# Patient Record
Sex: Female | Born: 1970
Health system: Southern US, Community
[De-identification: ages and names within clinical notes are randomized; demographics above are authoritative.]

## PROBLEM LIST (undated history)

## (undated) DIAGNOSIS — R5383 Other fatigue: Secondary | ICD-10-CM

## (undated) DIAGNOSIS — Z803 Family history of malignant neoplasm of breast: Secondary | ICD-10-CM

## (undated) DIAGNOSIS — Z780 Asymptomatic menopausal state: Secondary | ICD-10-CM

## (undated) DIAGNOSIS — Z8042 Family history of malignant neoplasm of prostate: Secondary | ICD-10-CM

## (undated) DIAGNOSIS — F329 Major depressive disorder, single episode, unspecified: Secondary | ICD-10-CM

## (undated) DIAGNOSIS — E079 Disorder of thyroid, unspecified: Secondary | ICD-10-CM

## (undated) DIAGNOSIS — I10 Essential (primary) hypertension: Secondary | ICD-10-CM

## (undated) DIAGNOSIS — E785 Hyperlipidemia, unspecified: Secondary | ICD-10-CM

## (undated) DIAGNOSIS — F411 Generalized anxiety disorder: Secondary | ICD-10-CM

## (undated) DIAGNOSIS — R51 Headache: Secondary | ICD-10-CM

## (undated) DIAGNOSIS — G43909 Migraine, unspecified, not intractable, without status migrainosus: Secondary | ICD-10-CM

## (undated) DIAGNOSIS — Z800 Family history of malignant neoplasm of digestive organs: Secondary | ICD-10-CM

## (undated) DIAGNOSIS — Z8481 Family history of carrier of genetic disease: Secondary | ICD-10-CM

## (undated) DIAGNOSIS — R7302 Impaired glucose tolerance (oral): Secondary | ICD-10-CM

## (undated) DIAGNOSIS — R5381 Other malaise: Secondary | ICD-10-CM

## (undated) HISTORY — DX: Family history of malignant neoplasm of digestive organs: Z80.0

## (undated) HISTORY — DX: Asymptomatic menopausal state: Z78.0

## (undated) HISTORY — DX: Major depressive disorder, single episode, unspecified: F32.9

## (undated) HISTORY — DX: Essential (primary) hypertension: I10

## (undated) HISTORY — DX: Hyperlipidemia, unspecified: E78.5

## (undated) HISTORY — DX: Headache: R51

## (undated) HISTORY — DX: Disorder of thyroid, unspecified: E07.9

## (undated) HISTORY — DX: Generalized anxiety disorder: F41.1

## (undated) HISTORY — DX: Other fatigue: R53.83

## (undated) HISTORY — DX: Impaired glucose tolerance (oral): R73.02

## (undated) HISTORY — DX: Other malaise: R53.81

## (undated) HISTORY — DX: Family history of carrier of genetic disease: Z84.81

## (undated) HISTORY — DX: Migraine, unspecified, not intractable, without status migrainosus: G43.909

## (undated) HISTORY — DX: Family history of malignant neoplasm of prostate: Z80.42

## (undated) HISTORY — DX: Family history of malignant neoplasm of breast: Z80.3

---

## 1998-04-12 ENCOUNTER — Other Ambulatory Visit: Admission: RE | Admit: 1998-04-12 | Discharge: 1998-04-12 | Payer: Self-pay | Admitting: *Deleted

## 1998-11-04 ENCOUNTER — Observation Stay (HOSPITAL_COMMUNITY): Admission: AD | Admit: 1998-11-04 | Discharge: 1998-11-05 | Payer: Self-pay | Admitting: *Deleted

## 1998-11-05 ENCOUNTER — Encounter: Payer: Self-pay | Admitting: *Deleted

## 1998-11-18 ENCOUNTER — Encounter (HOSPITAL_COMMUNITY): Admission: RE | Admit: 1998-11-18 | Discharge: 1998-11-25 | Payer: Self-pay | Admitting: *Deleted

## 1998-11-24 ENCOUNTER — Inpatient Hospital Stay (HOSPITAL_COMMUNITY): Admission: AD | Admit: 1998-11-24 | Discharge: 1998-11-26 | Payer: Self-pay | Admitting: Obstetrics and Gynecology

## 1998-11-26 ENCOUNTER — Encounter (HOSPITAL_COMMUNITY): Admission: RE | Admit: 1998-11-26 | Discharge: 1999-02-24 | Payer: Self-pay | Admitting: *Deleted

## 1999-01-04 ENCOUNTER — Other Ambulatory Visit: Admission: RE | Admit: 1999-01-04 | Discharge: 1999-01-04 | Payer: Self-pay | Admitting: *Deleted

## 2000-03-06 ENCOUNTER — Other Ambulatory Visit: Admission: RE | Admit: 2000-03-06 | Discharge: 2000-03-06 | Payer: Self-pay | Admitting: *Deleted

## 2002-05-04 ENCOUNTER — Other Ambulatory Visit: Admission: RE | Admit: 2002-05-04 | Discharge: 2002-05-04 | Payer: Self-pay | Admitting: Obstetrics & Gynecology

## 2003-05-10 ENCOUNTER — Other Ambulatory Visit: Admission: RE | Admit: 2003-05-10 | Discharge: 2003-05-10 | Payer: Self-pay | Admitting: Obstetrics and Gynecology

## 2003-07-15 ENCOUNTER — Ambulatory Visit: Admission: RE | Admit: 2003-07-15 | Discharge: 2003-07-15 | Payer: Self-pay | Admitting: Obstetrics & Gynecology

## 2003-07-15 ENCOUNTER — Encounter: Payer: Self-pay | Admitting: Cardiology

## 2003-12-16 ENCOUNTER — Inpatient Hospital Stay (HOSPITAL_COMMUNITY): Admission: AD | Admit: 2003-12-16 | Discharge: 2003-12-21 | Payer: Self-pay | Admitting: Obstetrics & Gynecology

## 2003-12-16 ENCOUNTER — Encounter (INDEPENDENT_AMBULATORY_CARE_PROVIDER_SITE_OTHER): Payer: Self-pay | Admitting: Specialist

## 2003-12-21 ENCOUNTER — Inpatient Hospital Stay (HOSPITAL_COMMUNITY): Admission: AD | Admit: 2003-12-21 | Discharge: 2003-12-24 | Payer: Self-pay | Admitting: Obstetrics and Gynecology

## 2003-12-22 ENCOUNTER — Encounter: Payer: Self-pay | Admitting: Obstetrics and Gynecology

## 2004-01-11 ENCOUNTER — Other Ambulatory Visit: Admission: RE | Admit: 2004-01-11 | Discharge: 2004-01-11 | Payer: Self-pay | Admitting: Obstetrics & Gynecology

## 2004-07-27 ENCOUNTER — Ambulatory Visit: Payer: Self-pay | Admitting: Internal Medicine

## 2004-09-01 ENCOUNTER — Ambulatory Visit: Payer: Self-pay | Admitting: Internal Medicine

## 2004-09-18 ENCOUNTER — Ambulatory Visit: Payer: Self-pay | Admitting: Internal Medicine

## 2005-10-10 ENCOUNTER — Ambulatory Visit: Payer: Self-pay | Admitting: Internal Medicine

## 2005-12-20 ENCOUNTER — Ambulatory Visit: Payer: Self-pay | Admitting: Internal Medicine

## 2006-01-09 ENCOUNTER — Encounter: Admission: RE | Admit: 2006-01-09 | Discharge: 2006-01-09 | Payer: Self-pay | Admitting: Obstetrics and Gynecology

## 2006-07-23 ENCOUNTER — Ambulatory Visit: Payer: Self-pay | Admitting: Internal Medicine

## 2006-07-23 LAB — CONVERTED CEMR LAB
ALT: 15 units/L (ref 0–40)
AST: 26 units/L (ref 0–37)
Albumin: 4 g/dL (ref 3.5–5.2)
Alkaline Phosphatase: 57 units/L (ref 39–117)
BUN: 9 mg/dL (ref 6–23)
Basophils Absolute: 0.2 10*3/uL — ABNORMAL HIGH (ref 0.0–0.1)
Basophils Relative: 3.6 % — ABNORMAL HIGH (ref 0.0–1.0)
CO2: 24 meq/L (ref 19–32)
Calcium: 9 mg/dL (ref 8.4–10.5)
Chloride: 103 meq/L (ref 96–112)
Chol/HDL Ratio, serum: 3.5
Cholesterol: 184 mg/dL (ref 0–200)
Creatinine, Ser: 0.7 mg/dL (ref 0.4–1.2)
Eosinophil percent: 2.5 % (ref 0.0–5.0)
GFR calc non Af Amer: 101 mL/min
Glomerular Filtration Rate, Af Am: 122 mL/min/{1.73_m2}
Glucose, Bld: 97 mg/dL (ref 70–99)
HCT: 36.7 % (ref 36.0–46.0)
HDL: 52.9 mg/dL (ref >39.0)
Hemoglobin: 12.7 g/dL (ref 12.0–15.0)
LDL Cholesterol: 122 mg/dL — ABNORMAL HIGH (ref 0–99)
Lymphocytes Relative: 27.1 % (ref 12.0–46.0)
MCHC: 34.7 g/dL (ref 30.0–36.0)
MCV: 94.4 fL (ref 78.0–100.0)
Monocytes Absolute: 0.3 10*3/uL (ref 0.2–0.7)
Monocytes Relative: 5.6 % (ref 3.0–11.0)
Neutro Abs: 3.7 10*3/uL (ref 1.4–7.7)
Neutrophils Relative %: 61.2 % (ref 43.0–77.0)
Platelets: 169 10*3/uL (ref 150–400)
Potassium: 4.2 meq/L (ref 3.5–5.1)
RBC: 3.89 M/uL (ref 3.87–5.11)
RDW: 11.7 % (ref 11.5–14.6)
Sodium: 135 meq/L (ref 135–145)
TSH: 1.58 microintl units/mL (ref 0.35–5.50)
Total Bilirubin: 1 mg/dL (ref 0.3–1.2)
Total Protein: 6.6 g/dL (ref 6.0–8.3)
Triglyceride fasting, serum: 48 mg/dL (ref 0–149)
VLDL: 10 mg/dL (ref 0–40)
WBC: 6 10*3/uL (ref 4.5–10.5)

## 2006-11-20 ENCOUNTER — Ambulatory Visit: Admission: RE | Admit: 2006-11-20 | Discharge: 2006-11-20 | Payer: Self-pay | Admitting: Gynecologic Oncology

## 2007-04-03 ENCOUNTER — Encounter: Payer: Self-pay | Admitting: Internal Medicine

## 2007-04-03 DIAGNOSIS — G43909 Migraine, unspecified, not intractable, without status migrainosus: Secondary | ICD-10-CM | POA: Insufficient documentation

## 2007-04-03 HISTORY — DX: Migraine, unspecified, not intractable, without status migrainosus: G43.909

## 2007-04-07 DIAGNOSIS — F329 Major depressive disorder, single episode, unspecified: Secondary | ICD-10-CM | POA: Insufficient documentation

## 2007-04-07 DIAGNOSIS — F411 Generalized anxiety disorder: Secondary | ICD-10-CM | POA: Insufficient documentation

## 2007-04-07 DIAGNOSIS — I10 Essential (primary) hypertension: Secondary | ICD-10-CM | POA: Insufficient documentation

## 2007-04-07 DIAGNOSIS — F3289 Other specified depressive episodes: Secondary | ICD-10-CM | POA: Insufficient documentation

## 2007-04-07 HISTORY — DX: Generalized anxiety disorder: F41.1

## 2007-04-07 HISTORY — DX: Major depressive disorder, single episode, unspecified: F32.9

## 2008-01-29 ENCOUNTER — Encounter: Admission: RE | Admit: 2008-01-29 | Discharge: 2008-01-29 | Payer: Self-pay | Admitting: *Deleted

## 2008-03-03 ENCOUNTER — Ambulatory Visit: Payer: Self-pay | Admitting: Internal Medicine

## 2008-03-03 DIAGNOSIS — R5381 Other malaise: Secondary | ICD-10-CM

## 2008-03-03 DIAGNOSIS — R351 Nocturia: Secondary | ICD-10-CM | POA: Insufficient documentation

## 2008-03-03 DIAGNOSIS — E739 Lactose intolerance, unspecified: Secondary | ICD-10-CM | POA: Insufficient documentation

## 2008-03-03 DIAGNOSIS — R51 Headache: Secondary | ICD-10-CM

## 2008-03-03 DIAGNOSIS — E785 Hyperlipidemia, unspecified: Secondary | ICD-10-CM

## 2008-03-03 DIAGNOSIS — R5383 Other fatigue: Secondary | ICD-10-CM

## 2008-03-03 DIAGNOSIS — R519 Headache, unspecified: Secondary | ICD-10-CM | POA: Insufficient documentation

## 2008-03-03 HISTORY — DX: Hyperlipidemia, unspecified: E78.5

## 2008-03-03 HISTORY — DX: Other malaise: R53.81

## 2008-03-03 HISTORY — DX: Headache: R51

## 2008-03-04 ENCOUNTER — Encounter: Payer: Self-pay | Admitting: Internal Medicine

## 2008-03-05 ENCOUNTER — Encounter: Payer: Self-pay | Admitting: Internal Medicine

## 2008-03-05 LAB — CONVERTED CEMR LAB
ALT: 13 units/L (ref 0–35)
AST: 19 units/L (ref 0–37)
Albumin: 4.6 g/dL (ref 3.5–5.2)
Alkaline Phosphatase: 63 units/L (ref 39–117)
BUN: 18 mg/dL (ref 6–23)
Basophils Absolute: 0 10*3/uL (ref 0.0–0.1)
Basophils Relative: 0.8 % (ref 0.0–3.0)
Bilirubin Urine: NEGATIVE
Bilirubin, Direct: 0.1 mg/dL (ref 0.0–0.3)
CO2: 27 meq/L (ref 19–32)
Calcium: 9.6 mg/dL (ref 8.4–10.5)
Chloride: 101 meq/L (ref 96–112)
Creatinine, Ser: 0.8 mg/dL (ref 0.4–1.2)
Crystals: NEGATIVE
Eosinophils Absolute: 0.1 10*3/uL (ref 0.0–0.7)
Eosinophils Relative: 1.3 % (ref 0.0–5.0)
GFR calc Af Amer: 104 mL/min
GFR calc non Af Amer: 86 mL/min
Glucose, Bld: 76 mg/dL (ref 70–99)
HCT: 41 % (ref 36.0–46.0)
Hemoglobin: 14 g/dL (ref 12.0–15.0)
Hgb A1c MFr Bld: 5.6 % (ref 4.6–6.0)
Ketones, ur: NEGATIVE mg/dL
Leukocytes, UA: NEGATIVE
Lipase: 26 units/L (ref 11.0–59.0)
Lymphocytes Relative: 26.3 % (ref 12.0–46.0)
MCHC: 34.1 g/dL (ref 30.0–36.0)
MCV: 92.3 fL (ref 78.0–100.0)
Monocytes Absolute: 0.5 10*3/uL (ref 0.1–1.0)
Monocytes Relative: 9.3 % (ref 3.0–12.0)
Neutro Abs: 3.5 10*3/uL (ref 1.4–7.7)
Neutrophils Relative %: 62.3 % (ref 43.0–77.0)
Nitrite: NEGATIVE
Platelets: 190 10*3/uL (ref 150–400)
Potassium: 3.7 meq/L (ref 3.5–5.1)
RBC: 4.44 M/uL (ref 3.87–5.11)
RDW: 11.7 % (ref 11.5–14.6)
Sodium: 136 meq/L (ref 135–145)
Specific Gravity, Urine: 1.015 (ref 1.000–1.03)
TSH: 2.56 microintl units/mL (ref 0.35–5.50)
Total Bilirubin: 1.1 mg/dL (ref 0.3–1.2)
Total Protein, Urine: NEGATIVE mg/dL
Total Protein: 7.4 g/dL (ref 6.0–8.3)
Urine Glucose: NEGATIVE mg/dL
Urobilinogen, UA: 0.2 (ref 0.0–1.0)
Vit D, 1,25-Dihydroxy: 40 (ref 30–89)
WBC: 5.6 10*3/uL (ref 4.5–10.5)
pH: 6 (ref 5.0–8.0)

## 2008-03-30 ENCOUNTER — Encounter: Admission: RE | Admit: 2008-03-30 | Discharge: 2008-03-30 | Payer: Self-pay | Admitting: Internal Medicine

## 2008-05-20 ENCOUNTER — Encounter: Payer: Self-pay | Admitting: Internal Medicine

## 2009-02-10 ENCOUNTER — Telehealth: Payer: Self-pay | Admitting: Internal Medicine

## 2009-02-15 ENCOUNTER — Encounter: Admission: RE | Admit: 2009-02-15 | Discharge: 2009-02-15 | Payer: Self-pay | Admitting: Obstetrics and Gynecology

## 2009-03-03 ENCOUNTER — Ambulatory Visit: Payer: Self-pay | Admitting: Internal Medicine

## 2009-03-03 LAB — CONVERTED CEMR LAB
ALT: 18 units/L (ref 0–35)
AST: 22 units/L (ref 0–37)
Albumin: 4.7 g/dL (ref 3.5–5.2)
Alkaline Phosphatase: 53 units/L (ref 39–117)
BUN: 12 mg/dL (ref 6–23)
Basophils Absolute: 0.3 10*3/uL — ABNORMAL HIGH (ref 0.0–0.1)
Basophils Relative: 3.5 % — ABNORMAL HIGH (ref 0.0–3.0)
Bilirubin Urine: NEGATIVE
Bilirubin, Direct: 0.1 mg/dL (ref 0.0–0.3)
CO2: 29 meq/L (ref 19–32)
Calcium: 9.8 mg/dL (ref 8.4–10.5)
Chloride: 104 meq/L (ref 96–112)
Cholesterol: 219 mg/dL — ABNORMAL HIGH (ref 0–200)
Creatinine, Ser: 0.8 mg/dL (ref 0.4–1.2)
Direct LDL: 136.2 mg/dL
Eosinophils Absolute: 0.1 10*3/uL (ref 0.0–0.7)
Eosinophils Relative: 1.1 % (ref 0.0–5.0)
GFR calc non Af Amer: 85.36 mL/min (ref >60)
Glucose, Bld: 96 mg/dL (ref 70–99)
HCT: 37.5 % (ref 36.0–46.0)
HDL: 71.9 mg/dL (ref >39.00)
Hemoglobin: 13.1 g/dL (ref 12.0–15.0)
Hgb A1c MFr Bld: 5.4 % (ref 4.6–6.5)
Ketones, ur: NEGATIVE mg/dL
Leukocytes, UA: NEGATIVE
Lymphocytes Relative: 27.9 % (ref 12.0–46.0)
Lymphs Abs: 2.1 10*3/uL (ref 0.7–4.0)
MCHC: 34.9 g/dL (ref 30.0–36.0)
MCV: 93.5 fL (ref 78.0–100.0)
Monocytes Absolute: 0.4 10*3/uL (ref 0.1–1.0)
Monocytes Relative: 5.2 % (ref 3.0–12.0)
Neutro Abs: 4.6 10*3/uL (ref 1.4–7.7)
Neutrophils Relative %: 62.3 % (ref 43.0–77.0)
Nitrite: NEGATIVE
Platelets: 168 10*3/uL (ref 150.0–400.0)
Potassium: 4.8 meq/L (ref 3.5–5.1)
RBC: 4.01 M/uL (ref 3.87–5.11)
RDW: 11.8 % (ref 11.5–14.6)
Sodium: 142 meq/L (ref 135–145)
Specific Gravity, Urine: >=1.03 (ref 1.000–1.030)
TSH: 1.92 microintl units/mL (ref 0.35–5.50)
Total Bilirubin: 0.8 mg/dL (ref 0.3–1.2)
Total CHOL/HDL Ratio: 3
Total Protein, Urine: NEGATIVE mg/dL
Total Protein: 7.6 g/dL (ref 6.0–8.3)
Triglycerides: 89 mg/dL (ref 0.0–149.0)
Urine Glucose: NEGATIVE mg/dL
Urobilinogen, UA: 0.2 (ref 0.0–1.0)
VLDL: 17.8 mg/dL (ref 0.0–40.0)
WBC: 7.5 10*3/uL (ref 4.5–10.5)
pH: 5.5 (ref 5.0–8.0)

## 2010-01-05 ENCOUNTER — Ambulatory Visit: Payer: Self-pay | Admitting: Internal Medicine

## 2010-02-27 ENCOUNTER — Ambulatory Visit: Payer: Self-pay | Admitting: Internal Medicine

## 2010-05-11 ENCOUNTER — Encounter: Admission: RE | Admit: 2010-05-11 | Discharge: 2010-05-11 | Payer: Self-pay | Admitting: Obstetrics and Gynecology

## 2010-05-17 ENCOUNTER — Encounter: Payer: Self-pay | Admitting: Internal Medicine

## 2010-09-07 NOTE — Assessment & Plan Note (Signed)
Summary: REOCURRING HEAD ACHES/LOW ENERGY--$50---STC   Vital Signs:  Patient Profile:   40 Years Old Female Weight:      153 pounds Temp:     97.1 degrees F oral Pulse rate:   69 / minute BP sitting:   116 / 81  (right arm) Cuff size:   regular  Vitals Entered By: Jerilynn Mages (March 03, 2008 8:38 AM)                 Chief Complaint:  reoccurring headaches/low energy/frequent urination/constipation.  History of Present Illness: lost 20 lbs she thinks intentionally since 12/07; has ongoing headache bitemproal low dull tension type without vision problem, n/v ; mother wants her to get he blood sugar checked - cbg by the mother at home was 166; terrible constipatoin, low energy; takes miralax daily; husband with vasectomy    Updated Prior Medication List: LISINOPRIL 10 MG  TABS (LISINOPRIL) 1 by mouth qd  Current Allergies (reviewed today): No known allergies   Past Medical History:    Reviewed history from 04/03/2007 and no changes required:       Hypertension       Migraine Headache       Anxiety       Depression       Hyperlipidemia  Past Surgical History:    Reviewed history from 04/03/2007 and no changes required:       Denies surgical history   Family History:    Reviewed history and no changes required:       2 cousins with early onset DM 66's (non-obese)       father with prostate cancer       mother iwth breast cancer       grandfather with heart disease and lung cancer       grandmother with HTN  Social History:    Reviewed history and no changes required:       Married       2 children       work - fulltime - Nature conservation officer       Never Smoked       Alcohol use-yes - social   Risk Factors:  Tobacco use:  never Alcohol use:  no   Review of Systems       all otherwise negative    Physical Exam  General:     alert and overweight-appearing, but has lost wt Head:     Normocephalic and atraumatic without obvious abnormalities. No  apparent alopecia or balding. Eyes:     No corneal or conjunctival inflammation noted. EOMI. Perrla. Ears:     External ear exam shows no significant lesions or deformities.  Otoscopic examination reveals clear canals, tympanic membranes are intact bilaterally without bulging, retraction, inflammation or discharge. Hearing is grossly normal bilaterally. Nose:     External nasal examination shows no deformity or inflammation. Nasal mucosa are pink and moist without lesions or exudates. Mouth:     Oral mucosa and oropharynx without lesions or exudates.  Teeth in good repair. Neck:     No deformities, masses, or tenderness noted. Lungs:     Normal respiratory effort, chest expands symmetrically. Lungs are clear to auscultation, no crackles or wheezes. Heart:     Normal rate and regular rhythm. S1 and S2 normal without gallop, murmur, click, rub or other extra sounds. Extremities:     no edema, no ulcers     Impression & Recommendations:  Problem # 1:  GLUCOSE INTOLERANCE (ICD-271.3) ? new DM with wt loss -  Orders: TLB-A1C / Hgb A1C (Glycohemoglobin) (83036-A1C) TLB-Lipase (83690-LIPASE)   Problem # 2:  FATIGUE (ICD-780.79) exam o/w benign - ok to follow with labs Orders: TLB-BMP (Basic Metabolic Panel-BMET) (80048-METABOL) TLB-CBC Platelet - w/Differential (85025-CBCD) TLB-Hepatic/Liver Function Pnl (80076-HEPATIC) TLB-TSH (Thyroid Stimulating Hormone) (84443-TSH) T-Vitamin D (25-Hydroxy) (16109-60454)   Problem # 3:  NOCTURIA (UJW-119.14) I suspect realted to elvated sugar - check urine as well Orders: TLB-Udip w/ Micro (81001-URINE) T-Culture, Urine (78295-62130)   Problem # 4:  HEADACHE (ICD-784.0) exam benign, ok to follow for now, consider MRI for persistent pain  Complete Medication List: 1)  Lisinopril 10 Mg Tabs (Lisinopril) .Marland Kitchen.. 1 by mouth qd   Patient Instructions: 1)  Please go to the Lab in the basement for your blood tests today 2)  Continue all  medications that you may have been taking previously 3)  If your sugar is high, you will be contacted about starting medication, and will likely be referred to Dr Everardo All, who is the endocrinologist for our group for best treatment 4)  Please schedule a follow-up appointment as needed. 5)  If the lab work is normal, but the headaches persist, please let us know, as you will likely want a Head MRI   ]

## 2010-09-07 NOTE — Assessment & Plan Note (Signed)
Summary: FU-MED-STC   Vital Signs:  Patient profile:   40 year old female Height:      69 inches Weight:      156.50 pounds BMI:     23.19 O2 Sat:      96 % on Room air Temp:     99.8 degrees F oral Pulse rate:   61 / minute BP sitting:   110 / 72  (left arm) Cuff size:   regular  Vitals Entered ByZella Ball Ewing (January 05, 2010 8:23 AM)  O2 Flow:  Room air CC: followup on BP medication/RE   CC:  followup on BP medication/RE.  History of Present Illness: here to f/iu ; has been on ACE since her daguther was born for the past 6 yrs, but now feels like "zombie": when she takes it in the AM;  BP at GYN was 105/60;  taking it in the evening seemed to help;  seemed to have a lot more energy on the days she did not take it;  wt post partum appoorx 6 yrs was was 155- 160 - no significent differene from now.  At one point she had gotten to 140 a few yrs ago and  BP was lower as well.  For the last 3 months has been doing more excericse with personal trainer - more muscle wt, less fat wt. Has been purposefully cutting out the salt in the diet for 3 mo as well as her "puffiness"  in the extremities. Pt denies CP, sob, doe, wheezing, orthopnea, pnd, worsening LE edema, palps, dizziness or syncope Pt denies new neuro symptoms such as headache, facial or extremity weakness    Problems Prior to Update: 1)  Preventive Health Care  (ICD-V70.0) 2)  Hyperlipidemia  (ICD-272.4) 3)  Headache  (ICD-784.0) 4)  Nocturia  (ICD-788.43) 5)  Fatigue  (ICD-780.79) 6)  Glucose Intolerance  (ICD-271.3) 7)  Depression  (ICD-311) 8)  Anxiety  (ICD-300.00) 9)  Hypertension  (ICD-401.9) 10)  Migraine Headache  (ICD-346.90)  Medications Prior to Update: 1)  Lisinopril 10 Mg  Tabs (Lisinopril) .Marland Kitchen.. 1 By Mouth Once Daily  Current Medications (verified): 1)  Lisinopril 5 Mg Tabs (Lisinopril) .Marland Kitchen.. 1po Once Daily  Allergies (verified): No Known Drug Allergies  Past History:  Past Medical History: Last  updated: 03/03/2008 Hypertension Migraine Headache Anxiety Depression Hyperlipidemia  Past Surgical History: Last updated: 04/03/2007 Denies surgical history  Social History: Last updated: 03/03/2008 Married 2 children work - fulltime - Nature conservation officer Never Smoked Alcohol use-yes - social  Risk Factors: Smoking Status: never (03/03/2008)  Review of Systems       all otherwise negative per pt -    Physical Exam  General:  alert and well-developed.   Head:  normocephalic and atraumatic.   Eyes:  vision grossly intact, pupils equal, and pupils round.   Ears:  R ear normal and L ear normal.   Nose:  no external deformity and no nasal discharge.   Mouth:  no gingival abnormalities and pharynx pink and moist.   Neck:  supple and no masses.   Lungs:  normal respiratory effort and normal breath sounds.   Heart:  normal rate and regular rhythm.   Abdomen:  soft, non-tender, and normal bowel sounds.   Extremities:  no edema, no erythema    Impression & Recommendations:  Problem # 1:  HYPERTENSION (ICD-401.9)  The following medications were removed from the medication list:    Lisinopril 10 Mg Tabs (Lisinopril) .Marland KitchenMarland KitchenMarland KitchenMarland Kitchen  1 by mouth once daily Her updated medication list for this problem includes:    Lisinopril 5 Mg Tabs (Lisinopril) .Marland Kitchen... 1po once daily  BP today: 110/72 Prior BP: 130/84 (03/03/2009)  Labs Reviewed: K+: 4.8 (03/03/2009) Creat: : 0.8 (03/03/2009)   Chol: 219 (03/03/2009)   HDL: 71.90 (03/03/2009)   LDL: 122 (07/23/2006)   TG: 89.0 (03/03/2009) treat as above, f/u any worsening signs or symptoms   Complete Medication List: 1)  Lisinopril 5 Mg Tabs (Lisinopril) .Marland Kitchen.. 1po once daily  Patient Instructions: 1)  decrease the lisinopril to 5 mg per day 2)  Continue all previous medications as before this visit 3)  Please schedule a follow-up appointment in 1 month with CPX labs Prescriptions: LISINOPRIL 5 MG TABS (LISINOPRIL) 1po once daily  #90 x 3    Entered and Authorized by:   Corwin Levins MD   Signed by:   Corwin Levins MD on 01/05/2010   Method used:   Print then Give to Patient   RxID:   [id number]

## 2010-09-07 NOTE — Assessment & Plan Note (Signed)
Summary: FU $50 STC   Vital Signs:  Patient profile:   40 year old female Height:      69 inches Weight:      158 pounds BMI:     23.42 O2 Sat:      98 % Temp:     97.5 degrees F oral Pulse rate:   66 / minute BP sitting:   130 / 84  (right arm) Cuff size:   regular  Vitals Entered By: Windell Norfolk (March 03, 2009 2:14 PM) CC: f/u on meds , med refills   CC:  f/u on meds  and med refills.  History of Present Illness: overall doing well, no complaints, not pregnant - husband with vasectomy; dneies CP, sob, doe, wheezing, orthopnea, pnd or LE edema  Problems Prior to Update: 1)  Preventive Health Care  (ICD-V70.0) 2)  Hyperlipidemia  (ICD-272.4) 3)  Headache  (ICD-784.0) 4)  Nocturia  (ICD-788.43) 5)  Fatigue  (ICD-780.79) 6)  Glucose Intolerance  (ICD-271.3) 7)  Depression  (ICD-311) 8)  Anxiety  (ICD-300.00) 9)  Hypertension  (ICD-401.9) 10)  Migraine Headache  (ICD-346.90)  Medications Prior to Update: 1)  Lisinopril 10 Mg  Tabs (Lisinopril) .Marland Kitchen.. 1 By Mouth Once Daily-- No Addtnl Refill Pt Overdue For Appt  Current Medications (verified): 1)  Lisinopril 10 Mg  Tabs (Lisinopril) .Marland Kitchen.. 1 By Mouth Once Daily  Allergies (verified): No Known Drug Allergies  Past History:  Past Medical History: Last updated: 03/03/2008 Hypertension Migraine Headache Anxiety Depression Hyperlipidemia  Past Surgical History: Last updated: 04/03/2007 Denies surgical history  Family History: Last updated: 03/03/2008 2 cousins with early onset DM 69's (non-obese) father with prostate cancer mother iwth breast cancer grandfather with heart disease and lung cancer grandmother with HTN  Social History: Last updated: 03/03/2008 Married 2 children work - fulltime - Nature conservation officer Never Smoked Alcohol use-yes - social  Risk Factors: Smoking Status: never (03/03/2008)  Review of Systems  The patient denies anorexia, fever, weight loss, weight gain, vision loss,  decreased hearing, hoarseness, chest pain, syncope, dyspnea on exertion, peripheral edema, prolonged cough, headaches, hemoptysis, abdominal pain, melena, hematochezia, severe indigestion/heartburn, hematuria, incontinence, muscle weakness, suspicious skin lesions, transient blindness, difficulty walking, depression, unusual weight change, abnormal bleeding, enlarged lymph nodes, angioedema, and breast masses.         all otherwise negative   Physical Exam  General:  alert and well-developed.   Head:  normocephalic and atraumatic.   Eyes:  vision grossly intact, pupils equal, and pupils round.   Ears:  R ear normal and L ear normal.   Nose:  no external deformity and no nasal discharge.   Mouth:  no gingival abnormalities and pharynx pink and moist.   Neck:  supple and no masses.   Lungs:  normal respiratory effort and normal breath sounds.   Heart:  normal rate and regular rhythm.   Abdomen:  soft, non-tender, and normal bowel sounds.   Msk:  no joint tenderness and no joint swelling.   Extremities:  no edema, no ulcers  Neurologic:  cranial nerves II-XII intact and strength normal in all extremities.     Impression & Recommendations:  Problem # 1:  Preventive Health Care (ICD-V70.0) Overall doing well, up to date, counseled on routine health concerns for screening and prevention, immunizations up to date or declined, labs ordered  Orders: TLB-BMP (Basic Metabolic Panel-BMET) (80048-METABOL) TLB-CBC Platelet - w/Differential (85025-CBCD) TLB-Hepatic/Liver Function Pnl (80076-HEPATIC) TLB-Lipid Panel (80061-LIPID) TLB-TSH (Thyroid Stimulating Hormone) (84443-TSH)  TLB-Udip ONLY (81003-UDIP)  Problem # 2:  GLUCOSE INTOLERANCE (ICD-271.3) asympt - to check a1c Orders: TLB-A1C / Hgb A1C (Glycohemoglobin) (83036-A1C)  Problem # 3:  HYPERTENSION (ICD-401.9)  Her updated medication list for this problem includes:    Lisinopril 10 Mg Tabs (Lisinopril) .Marland Kitchen... 1 by mouth once  daily stable overall by hx and exam, ok to continue meds/tx as is   Complete Medication List: 1)  Lisinopril 10 Mg Tabs (Lisinopril) .Marland Kitchen.. 1 by mouth once daily  Patient Instructions: 1)  Continue all medications that you may have been taking previously  2)  Please go to the Lab in the basement for your blood tests today 3)  Please schedule a follow-up appointment in 1 year or sooner if needed Prescriptions: LISINOPRIL 10 MG  TABS (LISINOPRIL) 1 by mouth once daily  #90 x 3   Entered and Authorized by:   Corwin Levins MD   Signed by:   Corwin Levins MD on 03/03/2009   Method used:   Print then Give to Patient   RxID:   [id number]

## 2010-09-07 NOTE — Progress Notes (Signed)
  Phone Note Refill Request Message from:  Fax from Pharmacy on February 10, 2009 1:25 PM  Refills Requested: Medication #1:  LISINOPRIL 10 MG  TABS 1 by mouth qd.   Dosage confirmed as above?Dosage Confirmed Initial call taken by: Windell Norfolk,  February 10, 2009 1:26 PM    New/Updated Medications: LISINOPRIL 10 MG  TABS (LISINOPRIL) 1 by mouth once daily-- NO ADDTNL REFILL PT OVERDUE FOR APPT   Prescriptions: LISINOPRIL 10 MG  TABS (LISINOPRIL) 1 by mouth once daily-- NO ADDTNL REFILL PT OVERDUE FOR APPT  #30 x 0   Entered by:   Windell Norfolk   Authorized by:   Corwin Levins MD   Signed by:   Windell Norfolk on 02/10/2009   Method used:   Electronically to        [facility] (retail)       [address]       [place], [place]  [address]       Ph: [phone]       Fax: [phone]   RxID:   [id number]

## 2010-09-07 NOTE — Miscellaneous (Signed)
Summary: Orders Update   Clinical Lists Changes  Orders: Added new Referral order of Radiology Referral (Radiology) - Signed 

## 2010-09-07 NOTE — Miscellaneous (Signed)
Summary: Immunization Entry    Influenza Vaccine (to be given today)   Appended Document: Immunization Entry Fluvrin 2009-10  Given at [facility] 05/20/2008

## 2010-09-07 NOTE — Assessment & Plan Note (Signed)
Summary: 1 mo rov /nws  #   Vital Signs:  Patient profile:   40 year old female Height:      69 inches Weight:      159.50 pounds BMI:     23.64 O2 Sat:      99 % on Room air Temp:     98.3 degrees F oral Pulse rate:   52 / minute BP sitting:   128 / 88  (left arm) Cuff size:   regular  Vitals Entered By: Zella Ball Ewing CMA (AAMA) (February 27, 2010 10:02 AM)  O2 Flow:  Room air  Preventive Care Screening     declines tetanus  CC: 1 month followup/RE   CC:  1 month followup/RE.  History of Present Illness: here to f/u - not taking the ace - take BP occasionally at home - usualy < 140/90; more excercisie lately and wathcing diet;  cue down on the salt ;  Pt denies CP, sob, doe, wheezing, orthopnea, pnd, worsening LE edema, palps, dizziness or syncope Pt denies new neuro symptoms such as headache, facial or extremity weakness  No fever, wt loss, night sweats, loss of appetite or other constitutional symptoms  No depressive symtpoms.  Was laid off but staying home with the kids and less stress.    Problems Prior to Update: 1)  Preventive Health Care  (ICD-V70.0) 2)  Hyperlipidemia  (ICD-272.4) 3)  Headache  (ICD-784.0) 4)  Nocturia  (ICD-788.43) 5)  Fatigue  (ICD-780.79) 6)  Glucose Intolerance  (ICD-271.3) 7)  Depression  (ICD-311) 8)  Anxiety  (ICD-300.00) 9)  Hypertension  (ICD-401.9) 10)  Migraine Headache  (ICD-346.90)  Medications Prior to Update: 1)  Lisinopril 5 Mg Tabs (Lisinopril) .Marland Kitchen.. 1po Once Daily  Current Medications (verified): 1)  Lisinopril 5 Mg Tabs (Lisinopril) .Marland Kitchen.. 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         all otherwise negative per pt -    Physical Exam  General:  alert and well-developed.   Head:  normocephalic and atraumatic.   Eyes:  vision grossly intact, pupils equal, and pupils round.   Ears:  R ear normal and L ear normal.   Nose:  no external deformity and no nasal discharge.   Mouth:  no gingival abnormalities and pharynx pink and moist.   Neck:  supple and no masses.   Lungs:  normal respiratory effort and normal breath sounds.   Heart:  normal rate and regular rhythm.   Abdomen:  soft, non-tender, and normal bowel sounds.   Msk:  no joint tenderness and no joint swelling.   Extremities:  no edema, no erythema  Neurologic:  cranial nerves II-XII intact and strength normal in all extremities.   Skin:  color normal and no rashes.   Psych:  not anxious appearing and not depressed appearing.     Impression & Recommendations:  Problem # 1:  Preventive Health Care (ICD-V70.0) Overall doing well, age appropriate education and counseling updated and referral for appropriate preventive services done unless declined, immunizations up to date or declined, diet counseling done if overweight, urged to quit smoking if smokes ,  most recent labs reviewed and current ordered if appropriate, ecg reviewed or declined (interpretation per ECG scanned in the EMR if done); information regarding Medicare Prevention requirements given if appropriate; speciality referrals updated as appropriate ; declines labs today  Complete Medication List: 1)  Lisinopril 5 Mg Tabs (Lisinopril) .Marland Kitchen.. 1po once daily

## 2010-12-22 NOTE — H&P (Signed)
NAMEJORGINA, April Ray             ACCOUNT NO.:  [id number]   MEDICAL RECORD NO.:  [id number]          PATIENT TYPE:  OUT   LOCATION:  GYN                          FACILITY:  [facility]   PHYSICIAN:  John T. Kyla Balzarine, M.D.    DATE OF BIRTH:  October 10, 1970   DATE OF ADMISSION:  11/20/2006  DATE OF DISCHARGE:                              HISTORY & PHYSICAL   CHIEF COMPLAINT:  Recurrent abnormal Pap smears suggesting low grade  SIL.   HISTORY OF PRESENT ILLNESS:  This patient relates longstanding normal  Pap smears until cesarean section for preeclampsia in 2005.  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  She subsequently normalized cytology until August 13, 2006  when she again had low grade SIL cytology.  She had persistent to low  grade SIL with possible high-grade SIL changes.  ECC was negative.  This  Pap smear was taken in the setting of a Mirena IUD.  The Mirena was  removed at the time of repeat LEEP biopsy with final pathology on  October 04, 2006 0comprising benign squamous mucosa with no  transformation zone identified and negative ECC, with benign  endocervical mucosa.  Because of failure to detect dysplasia, the  patient was sent for recommendations regarding management.   PAST HISTORY:  Dominated by hypertension complicating pregnancy with  severe preeclampsia or eclampsia.   MEDICATIONS:  Prinivil and Lexapro.   ALLERGIES:  None known.   FAMILY HISTORY:  Significant for heart disease and hypertension with a  couple of relatives having diabetes.  Her mother had breast cancer,  father prostate cancer, but no known ovarian or other gynecologic  malignancy.   PERSONAL AND SOCIAL HISTORY:  Married and denies tobacco but admits to  social ethanol.   REVIEW OF SYSTEMS:  NSVD x1 and cesarean section x1, SAB x1, otherwise  negative in the remainder of 10 system review.   PHYSICAL EXAMINATION:  Weight 171 pounds, blood  pressure 115/64,  respirations 18, pulse 76, temperature afebrile.  The patient is alert  and oriented x3 in no acute distress.  LYMPH SURVEY: Negative for pathologic lymphadenopathy.  ABDOMEN:  Soft and benign with well-healed transverse incision.  EXTREMITIES:  Have full strength and range of motion without edema,  cords or Homan's.  PELVIC:  External genitalia are without condylomatous  lesions and with vinegar staining, no acetowhite lesions.  I inspected  the vagina and cervix, revealing normal mucosa, bladder and urethra, and  well-healed cervical conization site.  Bimanual and rectovaginal  examinations disclose normal uterus with no adnexal pathology or  parametrium nodularity.   PROCEDURE NOTE:  After informed consent, I performed colposcopy of the  vaginal introitus and the vagina to the fornices, without visualization  of any acetowhite lesions.  The cervix had evidence of a healing cone  with complete re-epithelialization, but no pathologic acetowhite  epithelium.   ASSESSMENT:  Persistent low grade SIL cytology with negative recent  LEEP; no evidence of vaginal or vulvar dysplasia.   RECOMMENDATIONS:  I recommended that the patient have cytology  through  her primary gynecologist at 4-month intervals.  Indications for repeat  colposcopy would be persistence of low grade SIL for 24 months or the  progression of abnormalities on cytology beyond low grade SIL.  If she  normalized several consecutive Pap smears, she could refer to annual  follow-up.  I believe the majority of this follow-up could be carried  out under the auspices of Dr. Annabell Howells or her associates and we could see  her back at any time on a p.r.n. basis.      John T. Kyla Balzarine, M.D.  Electronically Signed     JTS/MEDQ  D:  11/20/2006  T:  11/20/2006  Job:  [id number]   cc:   Richardean Sale, M.D.  Fax: [phone]   Chester Holstein Earlene Plater, M.D.  Fax: [phone]

## 2010-12-22 NOTE — H&P (Signed)
NAME:  April Ray, April Ray                       ACCOUNT NO.:  [id number]   MEDICAL RECORD NO.:  [id number]                   PATIENT TYPE:  INP   LOCATION:  9372                                 FACILITY:  [facility]   PHYSICIAN:  Richardean Sale, M.D.                DATE OF BIRTH:  Nov 01, 1970   DATE OF ADMISSION:  12/16/2003  DATE OF DISCHARGE:                                HISTORY & PHYSICAL   ADMITTING DIAGNOSIS:  Thirty-three weeks pregnant with abdominal pain.   HISTORY OF PRESENT ILLNESS:  This is a 40 year old, gravida 3, para 1-0-1-1,  white female at 39 weeks and 5 days gestation who called on the evening of  Dec 16, 2003 at approximately 7:30 p.m. complaining of abdominal pain.  The  patient reported over the phone that her blood pressure had been elevated in  the office earlier that day.  She had been evaluated and was started on  [place], but the patient had yet to have the prescription fulfilled.  The  patient was instructed to report to maternity admissions for evaluation.   HOSPITAL COURSE:  The patient arrived at maternity admissions sometime after  8:30 that evening.  The patient's blood pressure was checked, and was  elevated at over 180/120.  Preeclampsia labs had already been ordered and  drawn.  I was contacted at approximately 9 p.m. with report of this blood  pressure, and I was already in the hospital on my way to evaluate the  patient.  I arrived shortly after 9 o'clock, and at that time the patient's  blood pressures were anywhere from 150's to 170's over 90's to 110.  Intravenous Labetalol was administered, 10 mg x1.  The patient was placed on  a cardiac monitor which resulted in very little improvement in her blood  pressure.  Repeat Labetalol was given at 10 mg.  The blood pressure came  down to the 150's to 160's over 90's, and then increased again.  During this  time, I interviewed the patient.  She had had problems with oligohydramnios  during this pregnancy,  and had been on home bed rest.  Amniotic fluid volume  was as low at 2 per the patient.  She stated that today in the office it was  up to 10.  The patient complained of abdominal pain that started at  approximately 7 p.m., and was intensified.  It was generally throughout the  entire abdomen and in the lower back.  She denied any chest pain or  shortness of breath.  No headache, no visual changes, but was just in a  significant amount of discomfort.  She denied any vaginal bleeding or loss  of fluid, and reported good fetal movement.  At this point, her laboratory  studies from her initial preeclampsia work-up came back.  The platelet count  was 145, hemoglobin was above 12.  Urinalysis was negative for any protein.  Liver function studies  and coagulation studies were not available.  The  patient started on magnesium sulfate, a 4 gm IV load, after which she  subsequently vomited.  Blood pressure ranged anywhere from 140 to 70 over 90  to 110 approximately.  The patient was given a 10 mg dose of hydralazine in  an attempt to bring her diastolic pressures down below 413.  During this  time, fetal heart rate tracing was obtained.  Baseline was in the 130's to  140's.  There were no decelerations noted.  Tocometer strip showed an  undulating pattern, suspicious for abruption.  Cervical exam revealed the  patient was 1 cm, 60% effaced, -1 station, and vertex with the head very  well applied.  There was no vaginal bleeding noted on examination.  At this  point, liver function panel came back.  LDH was elevated at approximately  295.  Uric acid was in the 7's.  Creatinine was 1.0.  ALT and AST were  normal.  On examination, the patient continued to complain of abdominal  pain.  Her abdomen was tense.  There was no focalized area of tenderness,  and no obvious rebound or guarding, and she did not have any localizing  symptoms to the right upper quadrant.   Given the significant elevated blood  pressures and persistent abdominal  pain, the patient was counseled on the need for delivery, despite her  prematurity, given her elevated pressures and abdominal pain, which was  suggestive of placental abruption, and the possibility of superimposed  preeclampsia resulting in HELLP syndrome.  At this point, the fetal heart  rate tracer remained in the 130's to 140's.  There were no decelerations  noted.  The patient was offered an attempt at induction of labor, given her  history of abruption and fast labor with previous infants.  I explained to  the patient there may be a need to proceed to cesarean section if the fetal  heart rate tracing was nonreassuring.  Recommended amniotomy as soon as the  patient was clinically stable to further assess fetal well being.  At this  point, once the patient's nausea and vomiting resolved, and she was  stabilized with her blood pressures running in the 150's over 80's, she was  transferred to labor and delivery for attempt at induction of labor.  Anesthesiology had already been consulted, given the patient's critical  condition, in the event that emergent cesarean section needed to be  performed.  The patient received intravenous Dilaudid 1 mg right before  leaving maternity admissions due to abdominal pain, so we could get her to  labor and delivery where we could further assess her.  She received an  addition 1 mg of Dilaudid en route, as her pain seemed to intensify.   When the patient arrived in labor and delivery, heart rate tracing was a bit  lower when she was first placed on the monitor, with a baseline in the  120's, and then slowly crept into the 130's and 140's.  There were again no  obvious late decelerations, but the tracing showed less variability.  Amniotomy was then performed, and a scalp electrode was placed.  Heart rate  tracing now revealed that the baseline was 130 and very flat with questionable __________-appearing late  decelerations.  Given this  significant finding, I explained to the patient that she was only 1 cm  dilated, and she needed to be delivered by emergent cesarean section for  nonreassuring fetal status.  The patient consented  to the procedure.   She was subsequently transferred to the operating room immediately.  In the  operating room, the scalp electrode was then reconnected, while the spinal  anesthesia was administered quickly.  The heart rate tracing was in the  130's, and then started to drop into the 120's and 110's right before the  time of the incision when the scalp electrode was removed.  The patient  arrived in the OR at 2222, incision was made at 2230 once her spinal was  adequate, and the baby was delivered at 2233.  The patient's blood pressures  remained stable in the operating room.   PAST MEDICAL HISTORY:  Denies any hospitalizations.   PAST SURGICAL HISTORY:  None.   PAST OBSTETRIC HISTORY:  A therapeutic AB x1.  Spontaneous vaginal delivery  at 37 weeks with subsequent elevated blood pressure and placental abruption.  The patient was not placed on magnesium sulfate during her labor.   PAST GYNECOLOGIC HISTORY:  No prior surgeries.  Denies any history of STD's.   FAMILY HISTORY:  Significant for mother with hypertension.  No history of  lupus, coagulopathy, or other blood dyscrasias.   SOCIAL HISTORY:  The patient is married.  Denies any alcohol, tobacco, or  drug use.  Specifically, denies cocaine use.   PHYSICAL EXAMINATION:  Please see above dictation with history of present  illness.  Addendum -  HEART:  Regular rate and rhythm.  LUNGS:  Clear to auscultation bilaterally.  ABDOMEN:  Fundal height appears AGA.  Fundus is nontender but tense during  contractions with little relaxation between contractions.  Cervix 1 cm, 50%  to 60% effaced, -1 station, and vertex.  EXTREMITIES:  No clubbing, cyanosis, or edema.  Deep tendon reflexes 2 to 3+  bilaterally with  no clonus.  NEUROLOGIC:  Nonfocal.   LABORATORY STUDIES:  Initial hemoglobin 12.9, platelet count 145.  ALT and  AST normal.  LDH approximately 295.  Uric acid above 7.  Creatinine 1.0.  PT  and PTT within normal limits.  Fibrinogen in the mid-300's.  Ultrasound  performed one week prior in the office per patient report revealed an AGA  fetus at approximately 4 pounds.  Amniotic fluid volume performed today in  the office was 10 per the patient's report.   ASSESSMENT:  A 40 year old, gravida 3, para 1-0-1-1, white female at 62 and  [redacted] weeks gestation with severe elevated blood pressure, abdominal pain,  borderline platelet count, elevated uric acid, and clinical picture  consistent with acute hypertensive crisis, possible superimposed  preeclampsia, possible placental abruption, possible early onset of HELLP  syndrome.   PLAN:  Please see above history of present illness of additional notes.  The patient was delivered to labor and delivery, started on magnesium sulfate,  given intravenous Labetalol x2, and hydralazine x1 in order to control blood  pressures.  Attempt was made at vaginal delivery, given the patient's  multiparity; however, fetal heart rate tracing changed upon admission to  labor and delivery.  Amniotomy was performed, there was a nonreassuring  tracing, and the patient was subsequently sent to the OR for emergent  cesarean section.  NICU notified and present at time of delivery.                                               Richardean Sale, M.D.  JW/MEDQ  D:  12/17/2003  T:  12/17/2003  Job:  [id number]

## 2010-12-22 NOTE — Discharge Summary (Signed)
NAME:  April Ray, April Ray                       ACCOUNT NO.:  [id number]   MEDICAL RECORD NO.:  [id number]                   PATIENT TYPE:  INP   LOCATION:  9372                                 FACILITY:  [facility]   PHYSICIAN:  Richardean Sale, M.D.                DATE OF BIRTH:  11-01-1970   DATE OF ADMISSION:  12/21/2003  DATE OF DISCHARGE:  12/24/2003                                 DISCHARGE SUMMARY   ADMITTING DIAGNOSIS:  Five days postpartum with persistent headache.   DISCHARGE DIAGNOSES:  1. Five days postpartum with persistent headache.  2. Severe hypertension with possible superimposed preeclampsia.  3. Chronic hypertension.   PROCEDURES:  1. Head CT without contrast performed on Dec 22, 2003 revealed a focal area     of abnormal density in the right parietal lobe.  There was no evidence of     ischemia, hemorrhage, or edema.  2. MRI performed on Dec 22, 2003 revealed no acute change but there were     chronic white matter changes consistent with chronic hypertension and     possible vasculitis.   HOSPITAL COURSE/HISTORY OF PRESENT ILLNESS:  This is a 40 year old gravida 2  para 1-1-0-2 white female status post emergent cesarean section on Dec 16, 2003 for hypertensive emergency with a complete concealed placental  abruption who was discharged to home on the date of this admission.  The  patient subsequently developed a headache shortly after discharge from the  hospital.  The patient's headache was not relieved with either Percocet or  ibuprofen.  She took a total of 1200 mg of ibuprofen within approximately 8  hours and that resulted in very little improvement in her headache.  The  headache was associated with nausea and was at worst a 10/10.  The patient  was instructed to record her blood pressures at home.  They were running in  the 130s to 140s over 80s to 90s and she was on labetalol 200 mg p.o. b.i.d.  She reported one pressure elevated in the 170s over 100s at home.   This was  at the start of her headache.  The patient denied any visual changes or  epigastric pain but did complain of nausea associated with the headache.  The patient's medical history is significant for intermittent hypertension  treated prior to pregnancy; recent hypertensive emergency requiring emergent  cesarean section on Dec 16, 2003; history of migraine; and history of  spontaneous vaginal delivery at 37 weeks with hypertension and a partial  abruption at that time.  On examination the patient's blood pressure was as  high as 170/115.  On exam she appeared in no acute distress but she kept  complaining of a gnawing headache.  Abdominal exam was consistent with  postoperative discomfort.  There was no epigastric pain elicited.  Extremities showed trace edema bilaterally.  They were nontender.  Deep  tendon reflexes were 3-4+ bilaterally with  one beat of clonus.  Preeclampsia  labs on admission:  LDH was mildly elevated at 284, hemoglobin 8.7, platelet  count 220, uric acid 4.9, ALT 30, AST 41; urine was negative for protein.   Given the severity of the patient's headache and labile blood pressures she  was admitted to the AICU.  She was started back on magnesium sulfate for  possible postpartum preeclampsia to prevent seizure.  She was given  intravenous labetalol as well as hydralazine intermittently to keep blood  pressures below 160/100.  The patient was monitored overnight.  Urine output  was adequate.  In the morning, however, her headache which had improved  during the night had returned.  Again she denied any visual changes or  epigastric pain.  Blood pressures were improving in the 130s to 140s over  70s to 90s.  Hydrochlorothiazide was added to her oral labetalol for  hypertension and a head CT was ordered to evaluate her persistent headache.  Results of the CAT scan showed no acute abnormality but there was a focal  change in density in the right parietal lobe.   Therefore, an MRI was  performed.  The MRI revealed no acute changes but was consistent with  changes in the white matter consistent with chronic hypertension and  possible vasculitis.  The patient underwent consultation with internal  medicine for further evaluation and treatment of her hypertension.  An ANA  was drawn which was negative at the time of admission.  The patient remained  on magnesium sulfate for approximately 24 hours.  Preeclampsia labs were  followed and returned to normal.  Urine output was followed and was  adequate.  Medications for her hypertension were adjusted per the internal  medicine physician.  Medications including labetalol 200 mg p.o. b.i.d.,  lisinopril 10 mg p.o. daily, and hydrochlorothiazide 25 mg p.o. daily.  On  May 20 the patient's blood pressures were much improved, her headache had  resolved, and she was cleared for discharge per internal medicine with  instructions to follow up within 1 week in the office for a blood pressure  check.   DISPOSITION:  To home.   CONDITION:  Improved.   MEDICATIONS:  1. Lisinopril 5 mg p.o. daily.  2. Hydrochlorothiazide 25 mg p.o. daily.  3. Labetalol 200 mg p.o. b.i.d.  4. Tylenol and ibuprofen p.r.n. headache.   FOLLOW-UP:  The patient is to follow up on Tuesday, Dec 28, 2003 with Dr.  Jonny Ruiz at [facility] and within 1 week at [facility] for  reevaluation.  The patient was given explicit instructions to call again for  any headache, visual changes, epigastric pain, or any unusual symptoms.                                               Richardean Sale, M.D.    JW/MEDQ  D:  01/08/2004  T:  01/10/2004  Job:  [id number]

## 2010-12-22 NOTE — Op Note (Signed)
NAME:  April Ray, April Ray                       ACCOUNT NO.:  [id number]   MEDICAL RECORD NO.:  [id number]                   PATIENT TYPE:  INP   LOCATION:  9372                                 FACILITY:  [facility]   PHYSICIAN:  Richardean Sale, M.D.                DATE OF BIRTH:  Dec 16, 1970   DATE OF PROCEDURE:  12/16/2003  DATE OF DISCHARGE:                                 OPERATIVE REPORT   PREOPERATIVE DIAGNOSES:  33 5/7 week intrauterine pregnancy with severe  hypertension, abdominal pain, pursestring suture superimposed preeclampsia  with placental abruption and nonreassuring fetal heart rate tracing.   POSTOPERATIVE DIAGNOSES:  33 5/7 week intrauterine pregnancy with severe  hypertension, abdominal pain, pursestring suture superimposed preeclampsia  with placental abruption and nonreassuring fetal heart rate tracing.  Complete placental abruption.   PROCEDURE:  Urgent primary low transverse cesarean section by a  Pfannenstiel.   SURGEON:  Richardean Sale, M.D.   ASSISTANT:  Hal Morales, M.D.   ANESTHESIA:  Spinal.   COMPLICATIONS:  None.   ESTIMATED BLOOD LOSS:  700 mL.   FINDINGS:  Viable female infant in the cephalic presentation with Apgar of  1, 4 and 7.  Arterial cord pH 6.8, complete placental abruption with clear  amniotic fluid noted upon entry into the uterus, rapid delivery of infant  followed complete extrusion of placenta with large amount of blood clot  present behind placenta. Clot appeared to be both acute and chronic. Normal  appearing uterus, normal appearing adnexa, pea sized anterior fibroid noted.   DESCRIPTION OF PROCEDURE:  The patient was taken urgently to the operating  room where she was administered spinal anesthesia and was then prepped and  draped in a rapid sterile fashion with the Betadine prep, placed in the  supine position in a leftward tilt, Foley catheter was placed. 10 mL of  0.25% Marcaine were injected and the area of the incision  to be made as  spinal anesthetic was not quite adequate. After injection of the local  anesthetic, a Pfannenstiel skin incision was then made with the scalpel,  this was carried down rapidly to the fascia. The fascia was then incised to  the midline, the incision was extended laterally with the Mayo scissors.  The superior and inferior aspect of the fascial incision was then grasped  with Kocher clamps, elevated and the underlying rectus muscle dissected off  with both sharp and blunt dissection. The muscles were then bluntly  separated in the midline, the peritoneum was then identified and entered  sharply. The peritoneal incision was then extended superiorly and inferiorly  with good visualization of the bladder. The bladder blade was inserted, the  bladder flap was created with both sharp and blunt dissection.  A low  transverse uterine incision was then made, this was extended manually, there  was clear amniotic fluid noted. The infant was delivered atraumatically  through the incision followed by immediate  delivery of the placenta.  The  cord was clamped and cut, the infant was handed off to the waiting NICU  attendants. Cord gas was sent, cord blood was unable to be obtained given  the rapid expulsion of the placenta and ratty appearance of the placenta.  The placenta was sent to pathology. The uterus was then exteriorized, the  uterus was cleared of all clot and debris and the uterine incision was  repaired with 1-0 chromic in a running locked fashion. The second layer of  the same suture was placed in an imbricating fashion to facilitate  hemostasis and in the event if the patient desired a trial of labor after C  section with future pregnancy. Once the uterine incision was hemostatic, the  uterus was inspected and appeared normal. The ovaries appeared normal as  well. The uterus was then returned in the abdomen, the pelvis was irrigated  copiously with warm normal saline. Any areas  of bleeding were identified and  were either suture ligated or cauterized. Once hemostasis was assured,  irrigation was performed again, there was no evidence of any active  bleeding. The peritoneum, bladder and subfascial areas were all very  carefully inspected, any areas of bleeding were then cauterized with Bovie  cautery.  Once hemostasis was assured, the fascia was closed with a running  Vicryl suture, subcutaneous space was then inspected, any areas of bleeding  were cauterized and the skin was closed with staples.   The patient tolerated the procedure well.  Sponge, lap, needle and  instrument counts were correct x2. The patient was taken to the in stable  condition and then later transferred __________ to monitor blood pressures.  The infant was transferred to the NICU after being resuscitated and  intubated by the NICU team and is off the ventilator and doing well at the  time of this dictation.                                               Richardean Sale, M.D.    JW/MEDQ  D:  12/17/2003  T:  12/17/2003  Job:  [id number]

## 2010-12-22 NOTE — Discharge Summary (Signed)
NAME:  DONYELLE, ENYEART                       ACCOUNT NO.:  [id number]   MEDICAL RECORD NO.:  [id number]                   PATIENT TYPE:  INP   LOCATION:  9128                                 FACILITY:  [facility]   PHYSICIAN:  Richardean Sale, M.D.                DATE OF BIRTH:  1970-11-06   DATE OF ADMISSION:  12/16/2003  DATE OF DISCHARGE:  12/21/2003                                 DISCHARGE SUMMARY   ADDENDUM TO DICTATION #045409:  Please change where it states magnesium  sulfate was administered for 24 hours postpartum; that needs to be changed  to 36 hours postpartum.                                               Richardean Sale, M.D.    JW/MEDQ  D:  01/08/2004  T:  01/10/2004  Job:  [id number]

## 2010-12-22 NOTE — Discharge Summary (Signed)
NAME:  April Ray, April Ray                       ACCOUNT NO.:  [id number]   MEDICAL RECORD NO.:  [id number]                   PATIENT TYPE:  INP   LOCATION:  9128                                 FACILITY:  [facility]   PHYSICIAN:  Richardean Sale, M.D.                DATE OF BIRTH:  03/30/1971   DATE OF ADMISSION:  12/16/2003  DATE OF DISCHARGE:  12/21/2003                                 DISCHARGE SUMMARY   ADMITTING DIAGNOSIS:  Severe hypertension at 33-and-a-half weeks gestation.   DISCHARGE DIAGNOSES:  1. Severe hypertension at 33-and-a-half weeks gestation.  2. Complete concealed placental abruption.  3. Status post urgent cesarean section.  4. Chronic hypertension with superimposed preeclampsia.   PROCEDURES:  Urgent primary low transverse cesarean section performed on Dec 16, 2003.   OPERATIVE FINDINGS:  Viable female infant in the cephalic presentation with  a complete concealed placental abruption.   HOSPITAL COURSE/HISTORY OF PRESENT ILLNESS:  This is a 40 year old gravida 3  para 1-0-1-1 white female at 33-and-a-half weeks gestation who presented on  the evening of Dec 16, 2003 complaining of abdominal pain.  The patient was  evaluated in maternity admissions, found to have unstable blood pressures up  to 180s over 120s.  Fetal heart rate tracing at that time was in the 130s  with no decelerations.  The patient was stabilized in maternity admissions,  given intravenous labetalol and hydralazine to help control her pressures,  and given intravenous magnesium sulfate for seizure prophylaxis to prevent  eclampsia.  Once the patient was stabilized she was subsequently transferred  to labor and delivery for monitoring and induction of labor given her  multigravida status.  Amniotomy was performed and fetal scalp electrode was  placed.  The patient was 1-2 cm dilated and fetal heart rate tracing was in  the 130s with absent variability and subtle late decelerations.  Given this  finding the patient was counseled on the need to intervene with urgent  cesarean section as she was remote from delivery.  She was subsequently  transferred to the operating room and a cesarean section was performed with  the finding as above.  Postoperatively the patient was transferred to the  [facility] for further treatment with magnesium sulfate to prevent eclamptic  seizures and to monitor serial blood pressures.  Oral labetalol was  administered to help control her hypertension.  Preeclampsia labs were drawn  q.8h. to monitor for signs of HELLP syndrome.  After 24 hours of magnesium  sulfate therapy the magnesium was discontinued and the patient was  transferred to the regular postpartum floor.  She was continued on labetalol  200 mg b.i.d. and on postoperative day #5 after her preeclampsia labs had  stabilized and her blood pressures remained below 150s over 90s she was  discharged to home tolerating a regular diet with a pain well controlled.  She was given explicit instructions to follow up the next day  in the office  for blood pressure check and laboratory studies.  Prior to discharge signs  and symptoms of preeclampsia were reviewed.  Specifically, the patient was  to call for headache, visual changes, epigastric pain.  Postoperative  instructions were reviewed as well.   MEDICATIONS:  1. Labetalol 200 mg p.o. b.i.d.  2. Ferrous sulfate 325 mg p.o. b.i.d.  3. Prenatal vitamin one tablet p.o. daily.  4. Percocet one to two tablets p.o. q.4-6h. p.r.n. pain.  5. Motrin 600-800 mg p.o. q.8h. p.r.n. pain.   LABORATORY STUDIES:  Hemoglobin on admission was 12.9, platelet count 145.  Hemoglobin on day of discharge 7.6, platelet count 166.  Lowest platelet  count was on postoperative day #1 on May 13 and was 112.  DIC panel was  performed which was within normal limits with the exception of PTT of 23  which is only slightly below normal.  Fibrinogen dropped to 276 on  postoperative day  #1, down from 341.  D-dimer was elevated at 18.  Liver  function tests:  ALT and AST remained within normal limits.  At the time of  discharge her AST was 43 which was mildly elevated, ALT was 32.  LDH peaked  at 369 on postoperative day #1 and at time of discharge was 204.  Magnesium  level ranged from 3.5 to 6.2.  Urinalysis was negative for protein.  Toxicology screen was positive for opiates but this was consistent with her  receiving narcotic medication.  It was negative for cocaine or any other  illegal substances.                                               Richardean Sale, M.D.    JW/MEDQ  D:  01/08/2004  T:  01/10/2004  Job:  [id number]

## 2011-05-21 ENCOUNTER — Other Ambulatory Visit: Payer: Self-pay | Admitting: Obstetrics and Gynecology

## 2011-05-21 DIAGNOSIS — Z1231 Encounter for screening mammogram for malignant neoplasm of breast: Secondary | ICD-10-CM

## 2011-06-11 ENCOUNTER — Ambulatory Visit
Admission: RE | Admit: 2011-06-11 | Discharge: 2011-06-11 | Disposition: A | Discharge status: Home / Self Care | Payer: BC Managed Care – PPO | Source: Ambulatory Visit | Attending: Obstetrics and Gynecology | Admitting: Obstetrics and Gynecology

## 2011-06-11 DIAGNOSIS — Z1231 Encounter for screening mammogram for malignant neoplasm of breast: Secondary | ICD-10-CM

## 2011-08-11 ENCOUNTER — Other Ambulatory Visit: Payer: Self-pay | Admitting: Internal Medicine

## 2011-08-14 ENCOUNTER — Encounter: Payer: Self-pay | Admitting: Internal Medicine

## 2011-08-14 DIAGNOSIS — R7302 Impaired glucose tolerance (oral): Secondary | ICD-10-CM

## 2011-08-14 DIAGNOSIS — Z0000 Encounter for general adult medical examination without abnormal findings: Secondary | ICD-10-CM | POA: Insufficient documentation

## 2011-08-14 HISTORY — DX: Impaired glucose tolerance (oral): R73.02

## 2011-08-16 ENCOUNTER — Encounter: Payer: Self-pay | Admitting: Internal Medicine

## 2011-08-16 ENCOUNTER — Ambulatory Visit (INDEPENDENT_AMBULATORY_CARE_PROVIDER_SITE_OTHER): Payer: BC Managed Care – PPO | Admitting: Internal Medicine

## 2011-08-16 VITALS — BP 110/72 | HR 75 | Temp 97.2°F | Ht 69.0 in | Wt 166.0 lb

## 2011-08-16 DIAGNOSIS — Z23 Encounter for immunization: Secondary | ICD-10-CM

## 2011-08-16 DIAGNOSIS — Z0000 Encounter for general adult medical examination without abnormal findings: Secondary | ICD-10-CM

## 2011-08-16 DIAGNOSIS — I10 Essential (primary) hypertension: Secondary | ICD-10-CM

## 2011-08-16 DIAGNOSIS — R7302 Impaired glucose tolerance (oral): Secondary | ICD-10-CM

## 2011-08-16 DIAGNOSIS — R7309 Other abnormal glucose: Secondary | ICD-10-CM

## 2011-08-16 MED ORDER — LISINOPRIL 10 MG PO TABS: 10.0000 mg | ORAL_TABLET | Freq: Every day | ORAL | Status: DC

## 2011-08-16 NOTE — Progress Notes (Signed)
Subjective:    Patient ID: April Ray, female    DOB: 1970-12-25, 41 y.o.   MRN: [id number]  HPI  Here for wellness and f/u;  Overall doing ok;  Pt denies CP, worsening SOB, DOE, wheezing, orthopnea, PND, worsening LE edema, palpitations, dizziness or syncope.  Pt denies neurological change such as new Headache, facial or extremity weakness.  Pt denies polydipsia, polyuria, or low sugar symptoms. Pt states overall good compliance with treatment and medications, good tolerability, and trying to follow lower cholesterol diet.  Pt denies worsening depressive symptoms, suicidal ideation or panic. No fever, wt loss, night sweats, loss of appetite, or other constitutional symptoms.  Pt states good ability with ADL's, low fall risk, home safety reviewed and adequate, no significant changes in hearing or vision, and occasionally active with exercise.  Due for flu shot.  Needs med refills.  Has been over 8 mo off of ACE for BP, has some wt increased over this time now with several BP checks elevated mild with headaches, usually 142 -150.  Took mother lisinopril 10 mg over thepast 2 days - BP better today.  Has seen OB recently as well with what sounds like elev FSH now in menopause early.  Not pregnant.   Past Medical History  Diagnosis Date  . Impaired glucose tolerance 08/14/2011  . ANXIETY 04/07/2007  . DEPRESSION 04/07/2007  . FATIGUE 03/03/2008  . Headache 03/03/2008  . HYPERLIPIDEMIA 03/03/2008  . MIGRAINE HEADACHE 04/03/2007  . Hypertension    No past surgical history on file.  reports that she has never smoked. She does not have any smokeless tobacco history on file. She reports that she drinks alcohol. Her drug history not on file. family history includes Cancer in her father, mother, and other; Diabetes in her other; Heart disease in her other; and Hypertension in her other. No Known Allergies No current outpatient prescriptions on file prior to visit.   No current outpatient prescriptions on  file prior to visit.    Review of Systems Review of Systems  Constitutional: Negative for diaphoresis, activity change, appetite change and unexpected weight change.  HENT: Negative for hearing loss, ear pain, facial swelling, mouth sores and neck stiffness.   Eyes: Negative for pain, redness and visual disturbance.  Respiratory: Negative for shortness of breath and wheezing.   Cardiovascular: Negative for chest pain and palpitations.  Gastrointestinal: Negative for diarrhea, blood in stool, abdominal distention and rectal pain.  Genitourinary: Negative for hematuria, flank pain and decreased urine volume.  Musculoskeletal: Negative for myalgias and joint swelling.  Skin: Negative for color change and wound.  Neurological: Negative for syncope and numbness.  Hematological: Negative for adenopathy.  Psychiatric/Behavioral: Negative for hallucinations, self-injury, decreased concentration and agitation.      Objective:   Physical Exam BP 110/72  Pulse 75  Temp(Src) 97.2 F (36.2 C) (Oral)  Ht 5\' 9"  (1.753 m)  Wt 166 lb (75.297 kg)  BMI 24.51 kg/m2  SpO2 97% Physical Exam  VS noted Constitutional: Pt is oriented to person, place, and time. Appears well-developed and well-nourished.  HENT:  Head: Normocephalic and atraumatic.  Right Ear: External ear normal.  Left Ear: External ear normal.  Nose: Nose normal.  Mouth/Throat: Oropharynx is clear and moist.  Eyes: Conjunctivae and EOM are normal. Pupils are equal, round, and reactive to light.  Neck: Normal range of motion. Neck supple. No JVD present. No tracheal deviation present.  Cardiovascular: Normal rate, regular rhythm, normal heart sounds and intact distal pulses.  Pulmonary/Chest: Effort normal and breath sounds normal.  Abdominal: Soft. Bowel sounds are normal. There is no tenderness.  Musculoskeletal: Normal range of motion. Exhibits no edema.  Lymphadenopathy:  Has no cervical adenopathy.  Neurological: Pt is alert  and oriented to person, place, and time. Pt has normal reflexes. No cranial nerve deficit.  Skin: Skin is warm and dry. No rash noted.  Psychiatric:  Has  normal mood and affect. Behavior is normal.     Assessment & Plan:

## 2011-08-16 NOTE — Patient Instructions (Signed)
You had the flu shot today Take all new medications as prescribed - the lisinopril 10 mg per day Please continue to focus on more exercise, lower calories and weight control Please go to LAB in the Basement for the blood and/or urine tests to be done in 4 Wks (not today) Please return in 1 year for your yearly visit, or sooner if needed, with Lab testing done 3-5 days before

## 2011-08-19 ENCOUNTER — Encounter: Payer: Self-pay | Admitting: Internal Medicine

## 2011-08-19 NOTE — Assessment & Plan Note (Signed)
To re-start the ACEm  to f/u any worsening symptoms or concerns

## 2011-08-19 NOTE — Assessment & Plan Note (Signed)
Asympt, for a1c 

## 2011-08-19 NOTE — Assessment & Plan Note (Signed)

## 2012-05-23 ENCOUNTER — Other Ambulatory Visit: Payer: Self-pay | Admitting: Obstetrics and Gynecology

## 2012-05-23 DIAGNOSIS — Z1231 Encounter for screening mammogram for malignant neoplasm of breast: Secondary | ICD-10-CM

## 2012-06-26 ENCOUNTER — Ambulatory Visit
Admission: RE | Admit: 2012-06-26 | Discharge: 2012-06-26 | Disposition: A | Discharge status: Home / Self Care | Payer: BC Managed Care – PPO | Source: Ambulatory Visit | Attending: Obstetrics and Gynecology | Admitting: Obstetrics and Gynecology

## 2012-06-26 DIAGNOSIS — Z1231 Encounter for screening mammogram for malignant neoplasm of breast: Secondary | ICD-10-CM

## 2012-06-30 LAB — HM MAMMOGRAPHY

## 2012-11-24 ENCOUNTER — Other Ambulatory Visit: Payer: Self-pay | Admitting: Internal Medicine

## 2012-12-18 ENCOUNTER — Encounter: Payer: Self-pay | Admitting: Internal Medicine

## 2012-12-18 ENCOUNTER — Other Ambulatory Visit (INDEPENDENT_AMBULATORY_CARE_PROVIDER_SITE_OTHER): Payer: BC Managed Care – PPO

## 2012-12-18 ENCOUNTER — Ambulatory Visit (INDEPENDENT_AMBULATORY_CARE_PROVIDER_SITE_OTHER): Payer: BC Managed Care – PPO | Admitting: Internal Medicine

## 2012-12-18 VITALS — BP 128/90 | HR 56 | Temp 97.8°F | Wt 171.0 lb

## 2012-12-18 DIAGNOSIS — Z0000 Encounter for general adult medical examination without abnormal findings: Secondary | ICD-10-CM

## 2012-12-18 DIAGNOSIS — Z780 Asymptomatic menopausal state: Secondary | ICD-10-CM | POA: Insufficient documentation

## 2012-12-18 DIAGNOSIS — Z803 Family history of malignant neoplasm of breast: Secondary | ICD-10-CM

## 2012-12-18 DIAGNOSIS — I10 Essential (primary) hypertension: Secondary | ICD-10-CM

## 2012-12-18 HISTORY — DX: Asymptomatic menopausal state: Z78.0

## 2012-12-18 HISTORY — DX: Family history of malignant neoplasm of breast: Z80.3

## 2012-12-18 LAB — BASIC METABOLIC PANEL
BUN: 12 mg/dL (ref 6–23)
CO2: 29 mEq/L (ref 19–32)
Calcium: 9.4 mg/dL (ref 8.4–10.5)
Chloride: 102 mEq/L (ref 96–112)
Creatinine, Ser: 0.7 mg/dL (ref 0.4–1.2)
GFR: 96.09 mL/min (ref >60.00)
Glucose, Bld: 95 mg/dL (ref 70–99)
Potassium: 4.6 mEq/L (ref 3.5–5.1)
Sodium: 138 mEq/L (ref 135–145)

## 2012-12-18 LAB — CBC WITH DIFFERENTIAL/PLATELET
Basophils Absolute: 0 10*3/uL (ref 0.0–0.1)
Basophils Relative: 0.5 % (ref 0.0–3.0)
Eosinophils Absolute: 0 10*3/uL (ref 0.0–0.7)
Eosinophils Relative: 0.5 % (ref 0.0–5.0)
HCT: 39.6 % (ref 36.0–46.0)
Hemoglobin: 13.5 g/dL (ref 12.0–15.0)
Lymphocytes Relative: 32.1 % (ref 12.0–46.0)
Lymphs Abs: 1.6 10*3/uL (ref 0.7–4.0)
MCHC: 34.2 g/dL (ref 30.0–36.0)
MCV: 91.5 fl (ref 78.0–100.0)
Monocytes Absolute: 0.4 10*3/uL (ref 0.1–1.0)
Monocytes Relative: 7.7 % (ref 3.0–12.0)
Neutro Abs: 2.9 10*3/uL (ref 1.4–7.7)
Neutrophils Relative %: 59.2 % (ref 43.0–77.0)
Platelets: 217 10*3/uL (ref 150.0–400.0)
RBC: 4.32 Mil/uL (ref 3.87–5.11)
RDW: 13.1 % (ref 11.5–14.6)
WBC: 4.8 10*3/uL (ref 4.5–10.5)

## 2012-12-18 LAB — HEPATIC FUNCTION PANEL
ALT: 14 U/L (ref 0–35)
AST: 21 U/L (ref 0–37)
Albumin: 4.2 g/dL (ref 3.5–5.2)
Alkaline Phosphatase: 54 U/L (ref 39–117)
Bilirubin, Direct: 0.1 mg/dL (ref 0.0–0.3)
Total Bilirubin: 0.5 mg/dL (ref 0.3–1.2)
Total Protein: 7.1 g/dL (ref 6.0–8.3)

## 2012-12-18 LAB — URINALYSIS, ROUTINE W REFLEX MICROSCOPIC
Bilirubin Urine: NEGATIVE
Ketones, ur: NEGATIVE
Leukocytes, UA: NEGATIVE
Nitrite: NEGATIVE
Specific Gravity, Urine: 1.01 (ref 1.000–1.030)
Total Protein, Urine: NEGATIVE
Urine Glucose: NEGATIVE
Urobilinogen, UA: 0.2 (ref 0.0–1.0)
pH: 8 (ref 5.0–8.0)

## 2012-12-18 LAB — LIPID PANEL
Cholesterol: 239 mg/dL — ABNORMAL HIGH (ref 0–200)
HDL: 70.6 mg/dL (ref >39.00)
Total CHOL/HDL Ratio: 3
Triglycerides: 63 mg/dL (ref 0.0–149.0)
VLDL: 12.6 mg/dL (ref 0.0–40.0)

## 2012-12-18 LAB — LDL CHOLESTEROL, DIRECT: Direct LDL: 140 mg/dL

## 2012-12-18 LAB — TSH: TSH: 2.32 u[IU]/mL (ref 0.35–5.50)

## 2012-12-18 MED ORDER — VENLAFAXINE HCL ER 75 MG PO CP24: 75.0000 mg | ORAL_CAPSULE | Freq: Every day | ORAL | Status: DC

## 2012-12-18 MED ORDER — LISINOPRIL 10 MG PO TABS: ORAL_TABLET | ORAL | Status: DC

## 2012-12-18 NOTE — Patient Instructions (Addendum)
Please take all new medication as prescribed- the increased dose of effexor to 75 mg Please continue all other medications as before, and refills have been done if requested - the lisinopril You are otherwise up to date with prevention measures today. Please continue your efforts at being more active, low cholesterol diet, and weight control. Please go to the LAB in the Basement (turn left off the elevator) for the tests to be done today You will be contacted by phone if any changes need to be made immediately.  Otherwise, you will receive a letter about your results with an explanation, but please check with MyChart first. Thank you for enrolling in MyChart. Please follow the instructions below to securely access your online medical record. MyChart allows you to send messages to your doctor, view your test results, renew your prescriptions, schedule appointments, and more. To Log into My Chart online, please go by [employer] or [employer] to [email or web address], or download the MyChart App from the [employer] .  Your Username is: Contractor ([email or web address]) Please return in 1 year for your yearly visit, or sooner if needed, with Lab testing done 3-5 days before

## 2012-12-18 NOTE — Progress Notes (Signed)
Subjective:    Patient ID: April Ray, female    DOB: 1971-01-02, 42 y.o.   MRN: [id number]  HPI Here for wellness and f/u;  Overall doing ok;  Pt denies CP, worsening SOB, DOE, wheezing, orthopnea, PND, worsening LE edema, palpitations, dizziness or syncope.  Pt denies neurological change such as new headache, facial or extremity weakness.  Pt denies polydipsia, polyuria, or low sugar symptoms. Pt states overall good compliance with treatment and medications, good tolerability, and has been trying to follow lower cholesterol diet.  Pt denies worsening depressive symptoms, suicidal ideation or panic. No fever, night sweats, wt loss, loss of appetite, or other constitutional symptoms.  Pt states good ability with ADL's, has low fall risk, home safety reviewed and adequate, no other significant changes in hearing or vision, and only occasionally active with exercise.  Taking effexor now, but asks for higher dose to due to lower mood, though has less hot flashes. Past Medical History  Diagnosis Date  . Impaired glucose tolerance 08/14/2011  . ANXIETY 04/07/2007  . DEPRESSION 04/07/2007  . FATIGUE 03/03/2008  . Headache 03/03/2008  . HYPERLIPIDEMIA 03/03/2008  . MIGRAINE HEADACHE 04/03/2007  . Hypertension   . Post-menopausal 12/18/2012  . Family history of breast cancer in first degree relative 12/18/2012    mother   History reviewed. No pertinent past surgical history.  reports that she has never smoked. She does not have any smokeless tobacco history on file. She reports that  drinks alcohol. Her drug history is not on file. family history includes Cancer in her father, mother, and other; Diabetes in her other; Heart disease in her other; and Hypertension in her other. No Known Allergies No current outpatient prescriptions on file prior to visit.   No current facility-administered medications on file prior to visit.    Review of Systems Constitutional: Negative for diaphoresis, activity  change, appetite change or unexpected weight change.  HENT: Negative for hearing loss, ear pain, facial swelling, mouth sores and neck stiffness.   Eyes: Negative for pain, redness and visual disturbance.  Respiratory: Negative for shortness of breath and wheezing.   Cardiovascular: Negative for chest pain and palpitations.  Gastrointestinal: Negative for diarrhea, blood in stool, abdominal distention or other pain Genitourinary: Negative for hematuria, flank pain or change in urine volume.  Musculoskeletal: Negative for myalgias and joint swelling.  Skin: Negative for color change and wound.  Neurological: Negative for syncope and numbness. other than noted Hematological: Negative for adenopathy.  Psychiatric/Behavioral: Negative for hallucinations, self-injury, decreased concentration and agitation.      Objective:   Physical Exam BP 128/90  Pulse 56  Temp(Src) 97.8 F (36.6 C) (Oral)  Wt 171 lb (77.565 kg)  BMI 25.24 kg/m2  SpO2 98% VS noted,  Constitutional: Pt is oriented to person, place, and time. Appears well-developed and well-nourished.  Head: Normocephalic and atraumatic.  Right Ear: External ear normal.  Left Ear: External ear normal.  Nose: Nose normal.  Mouth/Throat: Oropharynx is clear and moist.  Eyes: Conjunctivae and EOM are normal. Pupils are equal, round, and reactive to light.  Neck: Normal range of motion. Neck supple. No JVD present. No tracheal deviation present.  Cardiovascular: Normal rate, regular rhythm, normal heart sounds and intact distal pulses.   Pulmonary/Chest: Effort normal and breath sounds normal.  Abdominal: Soft. Bowel sounds are normal. There is no tenderness. No HSM  Musculoskeletal: Normal range of motion. Exhibits no edema.  Lymphadenopathy:  Has no cervical adenopathy.  Neurological: Pt is  alert and oriented to person, place, and time. Pt has normal reflexes. No cranial nerve deficit.  Skin: Skin is warm and dry. No rash noted.   Psychiatric:  Has  normal mood and affect. Behavior is normal.     Assessment & Plan:

## 2012-12-18 NOTE — Assessment & Plan Note (Signed)

## 2012-12-21 ENCOUNTER — Encounter: Payer: Self-pay | Admitting: Internal Medicine

## 2012-12-21 NOTE — Assessment & Plan Note (Signed)
stable overall by history and exam, recent data reviewed with pt, and pt to continue medical treatment as before,  to f/u any worsening symptoms or concerns BP Readings from Last 3 Encounters:  12/18/12 128/90  08/16/11 110/72  02/27/10 128/88

## 2012-12-21 NOTE — Assessment & Plan Note (Signed)
Ok for increased effexor to 75 qd

## 2013-06-11 ENCOUNTER — Other Ambulatory Visit: Payer: Self-pay

## 2013-09-18 ENCOUNTER — Ambulatory Visit (INDEPENDENT_AMBULATORY_CARE_PROVIDER_SITE_OTHER): Payer: BC Managed Care – PPO | Admitting: Internal Medicine

## 2013-09-18 ENCOUNTER — Encounter: Payer: Self-pay | Admitting: Internal Medicine

## 2013-09-18 VITALS — BP 124/86 | HR 69 | Temp 98.0°F | Resp 16 | Ht 69.0 in | Wt 177.0 lb

## 2013-09-18 DIAGNOSIS — J019 Acute sinusitis, unspecified: Secondary | ICD-10-CM

## 2013-09-18 MED ORDER — AMOXICILLIN-POT CLAVULANATE 875-125 MG PO TABS: 1.0000 | ORAL_TABLET | Freq: Two times a day (BID) | ORAL | Status: DC

## 2013-09-18 MED ORDER — METHYLPREDNISOLONE ACETATE 80 MG/ML IJ SUSP
120.0000 mg | Freq: Once | INTRAMUSCULAR | Status: AC
  Administered 2013-09-18: 120 mg via INTRAMUSCULAR

## 2013-09-18 NOTE — Progress Notes (Signed)
Pre visit review using our clinic review tool, if applicable. No additional management support is needed unless otherwise documented below in the visit note. 

## 2013-09-18 NOTE — Patient Instructions (Signed)

## 2013-09-18 NOTE — Progress Notes (Signed)
Subjective:    Patient ID: April Ray, female    DOB: [birth year], 43 y.o.   MRN: [id number]  Sinusitis This is a new problem. The current episode started 1 to 4 weeks ago. The problem has been gradually worsening since onset. There has been no fever. The fever has been present for less than 1 day. Her pain is at a severity of 0/10. She is experiencing no pain. Associated symptoms include congestion, sinus pressure, sneezing and a sore throat. Pertinent negatives include no chills, coughing, diaphoresis, ear pain, headaches, hoarse voice, neck pain or shortness of breath. Past treatments include oral decongestants. The treatment provided mild relief.      Review of Systems  Constitutional: Negative.  Negative for fever, chills, diaphoresis, appetite change and fatigue.  HENT: Positive for congestion, postnasal drip, rhinorrhea, sinus pressure, sneezing and sore throat. Negative for ear pain, hoarse voice, tinnitus, trouble swallowing and voice change.   Eyes: Negative.   Respiratory: Negative.  Negative for cough and shortness of breath.   Cardiovascular: Negative.  Negative for chest pain, palpitations and leg swelling.  Gastrointestinal: Negative.  Negative for abdominal pain.  Endocrine: Negative.   Genitourinary: Negative.   Musculoskeletal: Negative.  Negative for neck pain.  Skin: Negative.   Allergic/Immunologic: Negative.   Neurological: Negative.  Negative for headaches.  Hematological: Negative.  Negative for adenopathy. Does not bruise/bleed easily.  Psychiatric/Behavioral: Negative.        Objective:   Physical Exam  Vitals reviewed. Constitutional: She is oriented to person, place, and time. She appears well-developed and well-nourished.  Non-toxic appearance. She does not have a sickly appearance. She does not appear ill. No distress.  HENT:  Right Ear: Hearing, tympanic membrane, external ear and ear canal normal.  Left Ear: Hearing, tympanic membrane,  external ear and ear canal normal.  Nose: Mucosal edema and rhinorrhea present. No sinus tenderness or nasal deformity. No epistaxis.  No foreign bodies. Right sinus exhibits no maxillary sinus tenderness and no frontal sinus tenderness. Left sinus exhibits maxillary sinus tenderness. Left sinus exhibits no frontal sinus tenderness.  Mouth/Throat: Oropharynx is clear and moist and mucous membranes are normal. Mucous membranes are not pale, not dry and not cyanotic. No oral lesions. No trismus in the jaw. No uvula swelling. No oropharyngeal exudate, posterior oropharyngeal edema, posterior oropharyngeal erythema or tonsillar abscesses.  Eyes: Conjunctivae are normal. Right eye exhibits no discharge. Left eye exhibits no discharge. No scleral icterus.  Neck: Normal range of motion. Neck supple. No JVD present. No tracheal deviation present. No thyromegaly present.  Cardiovascular: Normal rate, regular rhythm, normal heart sounds and intact distal pulses.  Exam reveals no gallop and no friction rub.   No murmur heard. Pulmonary/Chest: Effort normal and breath sounds normal. No stridor. No respiratory distress. She has no wheezes. She has no rales. She exhibits no tenderness.  Abdominal: Soft. Bowel sounds are normal. She exhibits no distension and no mass. There is no tenderness. There is no rebound and no guarding.  Musculoskeletal: Normal range of motion. She exhibits no edema and no tenderness.  Lymphadenopathy:    She has no cervical adenopathy.  Neurological: She is oriented to person, place, and time.  Skin: Skin is warm and dry. No rash noted. She is not diaphoretic. No erythema. No pallor.     Lab Results  Component Value Date   WBC 4.8 12/18/2012   HGB 13.5 12/18/2012   HCT 39.6 12/18/2012   PLT 217.0 12/18/2012   GLUCOSE  95 12/18/2012   CHOL 239* 12/18/2012   TRIG 63.0 12/18/2012   HDL 70.60 12/18/2012   LDLDIRECT 140.0 12/18/2012   LDLCALC 122* 07/23/2006   ALT 14 12/18/2012   AST 21  12/18/2012   NA 138 12/18/2012   K 4.6 12/18/2012   CL 102 12/18/2012   CREATININE 0.7 12/18/2012   BUN 12 12/18/2012   CO2 29 12/18/2012   TSH 2.32 12/18/2012   HGBA1C 5.4 03/03/2009       Assessment & Plan:

## 2013-09-20 ENCOUNTER — Encounter: Payer: Self-pay | Admitting: Internal Medicine

## 2013-09-20 NOTE — Assessment & Plan Note (Signed)
She was given an injectio of depo-medrol to reduce the swelling and inflammation Will treat the infection with augmentin

## 2013-12-30 ENCOUNTER — Other Ambulatory Visit: Payer: Self-pay | Admitting: Dermatology

## 2014-01-06 ENCOUNTER — Other Ambulatory Visit: Payer: Self-pay | Admitting: Internal Medicine

## 2014-01-19 ENCOUNTER — Other Ambulatory Visit: Payer: Self-pay | Admitting: Dermatology

## 2014-04-11 ENCOUNTER — Other Ambulatory Visit: Payer: Self-pay | Admitting: Internal Medicine

## 2014-04-14 ENCOUNTER — Other Ambulatory Visit (INDEPENDENT_AMBULATORY_CARE_PROVIDER_SITE_OTHER): Payer: BC Managed Care – PPO

## 2014-04-14 ENCOUNTER — Ambulatory Visit (INDEPENDENT_AMBULATORY_CARE_PROVIDER_SITE_OTHER): Payer: BC Managed Care – PPO | Admitting: Internal Medicine

## 2014-04-14 ENCOUNTER — Telehealth: Payer: Self-pay | Admitting: Internal Medicine

## 2014-04-14 ENCOUNTER — Encounter: Payer: Self-pay | Admitting: Internal Medicine

## 2014-04-14 VITALS — BP 110/72 | HR 92 | Temp 98.0°F | Wt 171.0 lb

## 2014-04-14 DIAGNOSIS — Z0000 Encounter for general adult medical examination without abnormal findings: Secondary | ICD-10-CM

## 2014-04-14 LAB — URINALYSIS, ROUTINE W REFLEX MICROSCOPIC
Bilirubin Urine: NEGATIVE
Ketones, ur: NEGATIVE
Leukocytes, UA: NEGATIVE
Nitrite: NEGATIVE
Specific Gravity, Urine: 1.01 (ref 1.000–1.030)
Total Protein, Urine: NEGATIVE
Urine Glucose: NEGATIVE
Urobilinogen, UA: 0.2 (ref 0.0–1.0)
pH: 7.5 (ref 5.0–8.0)

## 2014-04-14 LAB — HEPATIC FUNCTION PANEL
ALT: 22 U/L (ref 0–35)
AST: 21 U/L (ref 0–37)
Albumin: 4 g/dL (ref 3.5–5.2)
Alkaline Phosphatase: 62 U/L (ref 39–117)
Bilirubin, Direct: 0.1 mg/dL (ref 0.0–0.3)
Total Bilirubin: 0.5 mg/dL (ref 0.2–1.2)
Total Protein: 7.1 g/dL (ref 6.0–8.3)

## 2014-04-14 LAB — CBC WITH DIFFERENTIAL/PLATELET
Basophils Absolute: 0 10*3/uL (ref 0.0–0.1)
Basophils Relative: 0.3 % (ref 0.0–3.0)
Eosinophils Absolute: 0.1 10*3/uL (ref 0.0–0.7)
Eosinophils Relative: 1.2 % (ref 0.0–5.0)
HCT: 37.6 % (ref 36.0–46.0)
Hemoglobin: 12.7 g/dL (ref 12.0–15.0)
Lymphocytes Relative: 23.4 % (ref 12.0–46.0)
Lymphs Abs: 1.8 10*3/uL (ref 0.7–4.0)
MCHC: 33.7 g/dL (ref 30.0–36.0)
MCV: 89.4 fl (ref 78.0–100.0)
Monocytes Absolute: 0.7 10*3/uL (ref 0.1–1.0)
Monocytes Relative: 9.1 % (ref 3.0–12.0)
Neutro Abs: 5.1 10*3/uL (ref 1.4–7.7)
Neutrophils Relative %: 66 % (ref 43.0–77.0)
Platelets: 232 10*3/uL (ref 150.0–400.0)
RBC: 4.2 Mil/uL (ref 3.87–5.11)
RDW: 12.8 % (ref 11.5–15.5)
WBC: 7.8 10*3/uL (ref 4.0–10.5)

## 2014-04-14 LAB — BASIC METABOLIC PANEL
BUN: 9 mg/dL (ref 6–23)
CO2: 27 mEq/L (ref 19–32)
Calcium: 9.4 mg/dL (ref 8.4–10.5)
Chloride: 103 mEq/L (ref 96–112)
Creatinine, Ser: 0.7 mg/dL (ref 0.4–1.2)
GFR: 105.72 mL/min (ref >60.00)
Glucose, Bld: 93 mg/dL (ref 70–99)
Potassium: 4.5 mEq/L (ref 3.5–5.1)
Sodium: 137 mEq/L (ref 135–145)

## 2014-04-14 LAB — LIPID PANEL
Cholesterol: 191 mg/dL (ref 0–200)
HDL: 62.3 mg/dL (ref >39.00)
LDL Cholesterol: 114 mg/dL — ABNORMAL HIGH (ref 0–99)
NonHDL: 128.7
Total CHOL/HDL Ratio: 3
Triglycerides: 76 mg/dL (ref 0.0–149.0)
VLDL: 15.2 mg/dL (ref 0.0–40.0)

## 2014-04-14 LAB — TSH: TSH: 0.03 u[IU]/mL — ABNORMAL LOW (ref 0.35–4.50)

## 2014-04-14 MED ORDER — VENLAFAXINE HCL ER 75 MG PO CP24: ORAL_CAPSULE | ORAL | Status: DC

## 2014-04-14 MED ORDER — LISINOPRIL 10 MG PO TABS: 10.0000 mg | ORAL_TABLET | Freq: Every day | ORAL | Status: DC

## 2014-04-14 NOTE — Patient Instructions (Signed)
Please continue all other medications as before, and refills have been done if requested.  Please have the pharmacy call with any other refills you may need.  Please continue your efforts at being more active, low cholesterol diet, and weight control.  You are otherwise up to date with prevention measures today.  Please keep your appointments with your specialists as you may have planned  Please go to the LAB in the Basement (turn left off the elevator) for the tests to be done today  You will be contacted by phone if any changes need to be made immediately.  Otherwise, you will receive a letter about your results with an explanation, but please check with MyChart first  Please return in 1 year for your yearly visit, or sooner if needed, with Lab testing done 3-5 days before  

## 2014-04-14 NOTE — Telephone Encounter (Signed)
Notified pt rx has been [facility] to [facility].../[facility] 

## 2014-04-14 NOTE — Telephone Encounter (Signed)
Patient is requesting refill on venlafaxine to be called in to [facility] in [place]

## 2014-04-14 NOTE — Progress Notes (Signed)
Subjective:    Patient ID: April Ray, female    DOB: [birth year], 43 y.o.   MRN: [id number]  HPI  Here for wellness and f/u;  Overall doing ok;  Pt denies CP, worsening SOB, DOE, wheezing, orthopnea, PND, worsening LE edema, palpitations, dizziness or syncope.  Pt denies neurological change such as new headache, facial or extremity weakness.  Pt denies polydipsia, polyuria, or low sugar symptoms. Pt states overall good compliance with treatment and medications, good tolerability, and has been trying to follow lower cholesterol diet.  Pt denies worsening depressive symptoms, suicidal ideation or panic. No fever, night sweats, wt loss, loss of appetite, or other constitutional symptoms.  Pt states good ability with ADL's, has low fall risk, home safety reviewed and adequate, no other significant changes in hearing or vision, and only occasionally active with exercise. Declines flu shot. Takes effexor for hot flashes per GYN, doing well Past Medical History  Diagnosis Date  . Impaired glucose tolerance 08/14/2011  . ANXIETY 04/07/2007  . DEPRESSION 04/07/2007  . FATIGUE 03/03/2008  . Headache(784.0) 03/03/2008  . HYPERLIPIDEMIA 03/03/2008  . MIGRAINE HEADACHE 04/03/2007  . Hypertension   . Post-menopausal 12/18/2012  . Family history of breast cancer in first degree relative 12/18/2012    mother   No past surgical history on file.  reports that she has never smoked. She has never used smokeless tobacco. She reports that she does not drink alcohol or use illicit drugs. family history includes Cancer in her father, mother, and other; Diabetes in her other; Heart disease in her other; Hypertension in her other. No Known Allergies Current Outpatient Prescriptions on File Prior to Visit  Medication Sig Dispense Refill  . venlafaxine XR (EFFEXOR-XR) 75 MG 24 hr capsule TAKE 1 CAPSULE (75 MG TOTAL) BY MOUTH DAILY.  90 capsule  0   No current facility-administered medications on file prior to visit.     Review of Systems Constitutional: Negative for increased diaphoresis, other activity, appetite or other siginficant weight change  HENT: Negative for worsening hearing loss, ear pain, facial swelling, mouth sores and neck stiffness.   Eyes: Negative for other worsening pain, redness or visual disturbance.  Respiratory: Negative for shortness of breath and wheezing.   Cardiovascular: Negative for chest pain and palpitations.  Gastrointestinal: Negative for diarrhea, blood in stool, abdominal distention or other pain Genitourinary: Negative for hematuria, flank pain or change in urine volume.  Musculoskeletal: Negative for myalgias or other joint complaints.  Skin: Negative for color change and wound.  Neurological: Negative for syncope and numbness. other than noted Hematological: Negative for adenopathy. or other swelling Psychiatric/Behavioral: Negative for hallucinations, self-injury, decreased concentration or other worsening agitation.      Objective:   Physical Exam BP 110/72  Pulse 92  Temp(Src) 98 F (36.7 C) (Oral)  Wt 171 lb (77.565 kg)  SpO2 94%  LMP 04/23/2011 VS noted,  Constitutional: Pt is oriented to person, place, and time. Appears well-developed and well-nourished.  Head: Normocephalic and atraumatic.  Right Ear: External ear normal.  Left Ear: External ear normal.  Nose: Nose normal.  Mouth/Throat: Oropharynx is clear and moist.  Eyes: Conjunctivae and EOM are normal. Pupils are equal, round, and reactive to light.  Neck: Normal range of motion. Neck supple. No JVD present. No tracheal deviation present.  Cardiovascular: Normal rate, regular rhythm, normal heart sounds and intact distal pulses.   Pulmonary/Chest: Effort normal and breath sounds without rales or wheezing  Abdominal: Soft. Bowel sounds are  normal. NT. No HSM  Musculoskeletal: Normal range of motion. Exhibits no edema.  Lymphadenopathy:  Has no cervical adenopathy.  Neurological: Pt is alert  and oriented to person, place, and time. Pt has normal reflexes. No cranial nerve deficit. Motor grossly intact Skin: Skin is warm and dry. No rash noted.  Psychiatric:  Has normal mood and affect. Behavior is normal.  Wt Readings from Last 3 Encounters:  04/14/14 171 lb (77.565 kg)  09/18/13 177 lb (80.287 kg)  12/18/12 171 lb (77.565 kg)       Assessment & Plan:

## 2014-04-14 NOTE — Assessment & Plan Note (Signed)

## 2014-04-14 NOTE — Progress Notes (Signed)
Pre visit review using our clinic review tool, if applicable. No additional management support is needed unless otherwise documented below in the visit note. 

## 2016-03-05 DIAGNOSIS — R0989 Other specified symptoms and signs involving the circulatory and respiratory systems: Secondary | ICD-10-CM | POA: Diagnosis not present

## 2016-03-05 DIAGNOSIS — R509 Fever, unspecified: Secondary | ICD-10-CM | POA: Diagnosis not present

## 2016-03-05 DIAGNOSIS — R05 Cough: Secondary | ICD-10-CM | POA: Diagnosis not present

## 2016-03-05 DIAGNOSIS — Z6827 Body mass index (BMI) 27.0-27.9, adult: Secondary | ICD-10-CM | POA: Diagnosis not present

## 2016-04-30 DIAGNOSIS — Z0000 Encounter for general adult medical examination without abnormal findings: Secondary | ICD-10-CM | POA: Diagnosis not present

## 2016-04-30 DIAGNOSIS — R8299 Other abnormal findings in urine: Secondary | ICD-10-CM | POA: Diagnosis not present

## 2016-04-30 DIAGNOSIS — E038 Other specified hypothyroidism: Secondary | ICD-10-CM | POA: Diagnosis not present

## 2016-05-01 DIAGNOSIS — H5201 Hypermetropia, right eye: Secondary | ICD-10-CM | POA: Diagnosis not present

## 2016-05-07 DIAGNOSIS — J3089 Other allergic rhinitis: Secondary | ICD-10-CM | POA: Diagnosis not present

## 2016-05-07 DIAGNOSIS — I10 Essential (primary) hypertension: Secondary | ICD-10-CM | POA: Diagnosis not present

## 2016-05-07 DIAGNOSIS — E038 Other specified hypothyroidism: Secondary | ICD-10-CM | POA: Diagnosis not present

## 2016-05-07 DIAGNOSIS — R8299 Other abnormal findings in urine: Secondary | ICD-10-CM | POA: Diagnosis not present

## 2016-05-07 DIAGNOSIS — Z1389 Encounter for screening for other disorder: Secondary | ICD-10-CM | POA: Diagnosis not present

## 2016-05-07 DIAGNOSIS — Z0000 Encounter for general adult medical examination without abnormal findings: Secondary | ICD-10-CM | POA: Diagnosis not present

## 2016-05-07 DIAGNOSIS — N951 Menopausal and female climacteric states: Secondary | ICD-10-CM | POA: Diagnosis not present

## 2016-06-22 DIAGNOSIS — Z6827 Body mass index (BMI) 27.0-27.9, adult: Secondary | ICD-10-CM | POA: Diagnosis not present

## 2016-06-22 DIAGNOSIS — Z1151 Encounter for screening for human papillomavirus (HPV): Secondary | ICD-10-CM | POA: Diagnosis not present

## 2016-06-22 DIAGNOSIS — Z01419 Encounter for gynecological examination (general) (routine) without abnormal findings: Secondary | ICD-10-CM | POA: Diagnosis not present

## 2016-06-25 DIAGNOSIS — Z1231 Encounter for screening mammogram for malignant neoplasm of breast: Secondary | ICD-10-CM | POA: Diagnosis not present

## 2016-10-01 DIAGNOSIS — E2749 Other adrenocortical insufficiency: Secondary | ICD-10-CM | POA: Diagnosis not present

## 2016-10-01 DIAGNOSIS — E038 Other specified hypothyroidism: Secondary | ICD-10-CM | POA: Diagnosis not present

## 2016-10-01 DIAGNOSIS — E2839 Other primary ovarian failure: Secondary | ICD-10-CM | POA: Diagnosis not present

## 2017-01-10 DIAGNOSIS — L570 Actinic keratosis: Secondary | ICD-10-CM | POA: Diagnosis not present

## 2017-01-10 DIAGNOSIS — D225 Melanocytic nevi of trunk: Secondary | ICD-10-CM | POA: Diagnosis not present

## 2017-01-10 DIAGNOSIS — D2272 Melanocytic nevi of left lower limb, including hip: Secondary | ICD-10-CM | POA: Diagnosis not present

## 2017-01-10 DIAGNOSIS — Z85828 Personal history of other malignant neoplasm of skin: Secondary | ICD-10-CM | POA: Diagnosis not present

## 2017-05-07 DIAGNOSIS — H5212 Myopia, left eye: Secondary | ICD-10-CM | POA: Diagnosis not present

## 2017-06-19 DIAGNOSIS — R7301 Impaired fasting glucose: Secondary | ICD-10-CM | POA: Diagnosis not present

## 2017-06-19 DIAGNOSIS — Z0000 Encounter for general adult medical examination without abnormal findings: Secondary | ICD-10-CM | POA: Diagnosis not present

## 2017-06-19 DIAGNOSIS — E038 Other specified hypothyroidism: Secondary | ICD-10-CM | POA: Diagnosis not present

## 2017-06-19 DIAGNOSIS — I10 Essential (primary) hypertension: Secondary | ICD-10-CM | POA: Diagnosis not present

## 2017-06-26 DIAGNOSIS — Z1389 Encounter for screening for other disorder: Secondary | ICD-10-CM | POA: Diagnosis not present

## 2017-06-26 DIAGNOSIS — Z0000 Encounter for general adult medical examination without abnormal findings: Secondary | ICD-10-CM | POA: Diagnosis not present

## 2017-06-26 DIAGNOSIS — G43909 Migraine, unspecified, not intractable, without status migrainosus: Secondary | ICD-10-CM | POA: Diagnosis not present

## 2017-06-26 DIAGNOSIS — N951 Menopausal and female climacteric states: Secondary | ICD-10-CM | POA: Diagnosis not present

## 2017-06-26 DIAGNOSIS — R7301 Impaired fasting glucose: Secondary | ICD-10-CM | POA: Diagnosis not present

## 2017-06-26 DIAGNOSIS — J3089 Other allergic rhinitis: Secondary | ICD-10-CM | POA: Diagnosis not present

## 2017-07-11 DIAGNOSIS — Z1231 Encounter for screening mammogram for malignant neoplasm of breast: Secondary | ICD-10-CM | POA: Diagnosis not present

## 2017-07-25 DIAGNOSIS — E038 Other specified hypothyroidism: Secondary | ICD-10-CM | POA: Diagnosis not present

## 2017-08-07 DIAGNOSIS — K5901 Slow transit constipation: Secondary | ICD-10-CM | POA: Diagnosis not present

## 2017-08-07 DIAGNOSIS — R1013 Epigastric pain: Secondary | ICD-10-CM | POA: Diagnosis not present

## 2017-08-07 DIAGNOSIS — K921 Melena: Secondary | ICD-10-CM | POA: Diagnosis not present

## 2017-08-30 DIAGNOSIS — Z6828 Body mass index (BMI) 28.0-28.9, adult: Secondary | ICD-10-CM | POA: Diagnosis not present

## 2017-08-30 DIAGNOSIS — N920 Excessive and frequent menstruation with regular cycle: Secondary | ICD-10-CM | POA: Diagnosis not present

## 2017-08-30 DIAGNOSIS — Z01419 Encounter for gynecological examination (general) (routine) without abnormal findings: Secondary | ICD-10-CM | POA: Diagnosis not present

## 2017-08-30 DIAGNOSIS — N952 Postmenopausal atrophic vaginitis: Secondary | ICD-10-CM | POA: Diagnosis not present

## 2017-08-30 DIAGNOSIS — N941 Unspecified dyspareunia: Secondary | ICD-10-CM | POA: Diagnosis not present

## 2017-08-30 DIAGNOSIS — Z1151 Encounter for screening for human papillomavirus (HPV): Secondary | ICD-10-CM | POA: Diagnosis not present

## 2017-08-30 DIAGNOSIS — Z01411 Encounter for gynecological examination (general) (routine) with abnormal findings: Secondary | ICD-10-CM | POA: Diagnosis not present

## 2017-12-11 DIAGNOSIS — R05 Cough: Secondary | ICD-10-CM | POA: Diagnosis not present

## 2017-12-11 DIAGNOSIS — J019 Acute sinusitis, unspecified: Secondary | ICD-10-CM | POA: Diagnosis not present

## 2017-12-11 DIAGNOSIS — R0989 Other specified symptoms and signs involving the circulatory and respiratory systems: Secondary | ICD-10-CM | POA: Diagnosis not present

## 2017-12-11 DIAGNOSIS — J209 Acute bronchitis, unspecified: Secondary | ICD-10-CM | POA: Diagnosis not present

## 2018-08-13 DIAGNOSIS — I10 Essential (primary) hypertension: Secondary | ICD-10-CM | POA: Diagnosis not present

## 2018-08-13 DIAGNOSIS — R82998 Other abnormal findings in urine: Secondary | ICD-10-CM | POA: Diagnosis not present

## 2018-08-13 DIAGNOSIS — R7301 Impaired fasting glucose: Secondary | ICD-10-CM | POA: Diagnosis not present

## 2018-08-13 DIAGNOSIS — E785 Hyperlipidemia, unspecified: Secondary | ICD-10-CM | POA: Diagnosis not present

## 2018-08-13 DIAGNOSIS — Z0000 Encounter for general adult medical examination without abnormal findings: Secondary | ICD-10-CM | POA: Diagnosis not present

## 2018-08-13 DIAGNOSIS — E038 Other specified hypothyroidism: Secondary | ICD-10-CM | POA: Diagnosis not present

## 2018-08-20 DIAGNOSIS — E7849 Other hyperlipidemia: Secondary | ICD-10-CM | POA: Diagnosis not present

## 2018-08-20 DIAGNOSIS — I10 Essential (primary) hypertension: Secondary | ICD-10-CM | POA: Diagnosis not present

## 2018-08-20 DIAGNOSIS — Z0000 Encounter for general adult medical examination without abnormal findings: Secondary | ICD-10-CM | POA: Diagnosis not present

## 2018-08-20 DIAGNOSIS — Z1331 Encounter for screening for depression: Secondary | ICD-10-CM | POA: Diagnosis not present

## 2018-08-20 DIAGNOSIS — E28319 Asymptomatic premature menopause: Secondary | ICD-10-CM | POA: Diagnosis not present

## 2018-08-20 DIAGNOSIS — R3121 Asymptomatic microscopic hematuria: Secondary | ICD-10-CM | POA: Diagnosis not present

## 2018-10-08 DIAGNOSIS — Z1231 Encounter for screening mammogram for malignant neoplasm of breast: Secondary | ICD-10-CM | POA: Diagnosis not present

## 2018-10-08 DIAGNOSIS — Z1151 Encounter for screening for human papillomavirus (HPV): Secondary | ICD-10-CM | POA: Diagnosis not present

## 2018-10-08 DIAGNOSIS — Z6828 Body mass index (BMI) 28.0-28.9, adult: Secondary | ICD-10-CM | POA: Diagnosis not present

## 2018-10-08 DIAGNOSIS — Z01419 Encounter for gynecological examination (general) (routine) without abnormal findings: Secondary | ICD-10-CM | POA: Diagnosis not present

## 2018-10-08 DIAGNOSIS — Z113 Encounter for screening for infections with a predominantly sexual mode of transmission: Secondary | ICD-10-CM | POA: Diagnosis not present

## 2018-12-31 DIAGNOSIS — M79671 Pain in right foot: Secondary | ICD-10-CM | POA: Diagnosis not present

## 2019-01-07 DIAGNOSIS — M79671 Pain in right foot: Secondary | ICD-10-CM | POA: Diagnosis not present

## 2019-01-23 DIAGNOSIS — M79671 Pain in right foot: Secondary | ICD-10-CM | POA: Diagnosis not present

## 2019-05-06 DIAGNOSIS — M71571 Other bursitis, not elsewhere classified, right ankle and foot: Secondary | ICD-10-CM | POA: Diagnosis not present

## 2019-05-06 DIAGNOSIS — M7731 Calcaneal spur, right foot: Secondary | ICD-10-CM | POA: Diagnosis not present

## 2019-05-06 DIAGNOSIS — M722 Plantar fascial fibromatosis: Secondary | ICD-10-CM | POA: Diagnosis not present

## 2019-05-13 DIAGNOSIS — M722 Plantar fascial fibromatosis: Secondary | ICD-10-CM | POA: Diagnosis not present

## 2019-05-13 DIAGNOSIS — M71571 Other bursitis, not elsewhere classified, right ankle and foot: Secondary | ICD-10-CM | POA: Diagnosis not present

## 2019-05-20 DIAGNOSIS — M71571 Other bursitis, not elsewhere classified, right ankle and foot: Secondary | ICD-10-CM | POA: Diagnosis not present

## 2019-05-20 DIAGNOSIS — M722 Plantar fascial fibromatosis: Secondary | ICD-10-CM | POA: Diagnosis not present

## 2019-06-12 ENCOUNTER — Other Ambulatory Visit: Payer: Self-pay

## 2019-06-12 DIAGNOSIS — Z20822 Contact with and (suspected) exposure to covid-19: Secondary | ICD-10-CM

## 2019-06-13 LAB — NOVEL CORONAVIRUS, NAA: SARS-CoV-2, NAA: NOT DETECTED

## 2019-07-24 ENCOUNTER — Ambulatory Visit: Payer: HRSA Program | Attending: Internal Medicine

## 2019-07-24 DIAGNOSIS — Z20828 Contact with and (suspected) exposure to other viral communicable diseases: Secondary | ICD-10-CM | POA: Insufficient documentation

## 2019-07-24 DIAGNOSIS — Z20822 Contact with and (suspected) exposure to covid-19: Secondary | ICD-10-CM

## 2019-07-25 LAB — NOVEL CORONAVIRUS, NAA: SARS-CoV-2, NAA: NOT DETECTED

## 2019-09-29 DIAGNOSIS — E039 Hypothyroidism, unspecified: Secondary | ICD-10-CM | POA: Diagnosis not present

## 2019-09-29 DIAGNOSIS — R7301 Impaired fasting glucose: Secondary | ICD-10-CM | POA: Diagnosis not present

## 2019-09-29 DIAGNOSIS — Z0000 Encounter for general adult medical examination without abnormal findings: Secondary | ICD-10-CM | POA: Diagnosis not present

## 2019-09-29 DIAGNOSIS — E7849 Other hyperlipidemia: Secondary | ICD-10-CM | POA: Diagnosis not present

## 2019-10-01 DIAGNOSIS — F329 Major depressive disorder, single episode, unspecified: Secondary | ICD-10-CM | POA: Diagnosis not present

## 2019-10-01 DIAGNOSIS — E28319 Asymptomatic premature menopause: Secondary | ICD-10-CM | POA: Diagnosis not present

## 2019-10-01 DIAGNOSIS — R3121 Asymptomatic microscopic hematuria: Secondary | ICD-10-CM | POA: Diagnosis not present

## 2019-10-01 DIAGNOSIS — E785 Hyperlipidemia, unspecified: Secondary | ICD-10-CM | POA: Diagnosis not present

## 2019-10-01 DIAGNOSIS — Z1331 Encounter for screening for depression: Secondary | ICD-10-CM | POA: Diagnosis not present

## 2019-10-01 DIAGNOSIS — Z0000 Encounter for general adult medical examination without abnormal findings: Secondary | ICD-10-CM | POA: Diagnosis not present

## 2019-11-20 DIAGNOSIS — F329 Major depressive disorder, single episode, unspecified: Secondary | ICD-10-CM | POA: Diagnosis not present

## 2019-11-20 DIAGNOSIS — Z1331 Encounter for screening for depression: Secondary | ICD-10-CM | POA: Diagnosis not present

## 2019-11-20 DIAGNOSIS — E039 Hypothyroidism, unspecified: Secondary | ICD-10-CM | POA: Diagnosis not present

## 2019-11-20 DIAGNOSIS — R7301 Impaired fasting glucose: Secondary | ICD-10-CM | POA: Diagnosis not present

## 2019-11-20 DIAGNOSIS — I10 Essential (primary) hypertension: Secondary | ICD-10-CM | POA: Diagnosis not present

## 2019-11-24 DIAGNOSIS — D485 Neoplasm of uncertain behavior of skin: Secondary | ICD-10-CM | POA: Diagnosis not present

## 2019-11-24 DIAGNOSIS — D2372 Other benign neoplasm of skin of left lower limb, including hip: Secondary | ICD-10-CM | POA: Diagnosis not present

## 2020-02-26 DIAGNOSIS — M722 Plantar fascial fibromatosis: Secondary | ICD-10-CM | POA: Diagnosis not present

## 2020-06-06 DIAGNOSIS — U071 COVID-19: Secondary | ICD-10-CM | POA: Diagnosis not present

## 2020-06-06 DIAGNOSIS — Z1152 Encounter for screening for COVID-19: Secondary | ICD-10-CM | POA: Diagnosis not present

## 2020-06-06 DIAGNOSIS — R059 Cough, unspecified: Secondary | ICD-10-CM | POA: Diagnosis not present

## 2020-06-14 ENCOUNTER — Other Ambulatory Visit (HOSPITAL_COMMUNITY): Payer: Self-pay | Admitting: Internal Medicine

## 2020-06-14 ENCOUNTER — Telehealth: Payer: Self-pay | Admitting: Unknown Physician Specialty

## 2020-06-14 NOTE — Telephone Encounter (Signed)
Called to discuss with April Ray about Covid symptoms and the use of  monoclonal antibody infusion for those with mild to moderate Covid symptoms and at a high risk of hospitalization.     Pt does not qualify for infusion therapy as her symptoms first presented > 10 days prior to timing of infusion (est Oct 27). Symptoms tier reviewed as well as criteria for ending isolation. Preventative practices reviewed. Patient verbalized understanding   Patient Active Problem List   Diagnosis Date Noted  . Post-menopausal 12/18/2012  . Family history of breast cancer in first degree relative 12/18/2012  . Impaired glucose tolerance 08/14/2011  . Preventative health care 08/14/2011  . HYPERLIPIDEMIA 03/03/2008  . FATIGUE 03/03/2008  . Headache(784.0) 03/03/2008  . Nocturia 03/03/2008  . ANXIETY 04/07/2007  . DEPRESSION 04/07/2007  . HYPERTENSION 04/07/2007  . MIGRAINE HEADACHE 04/03/2007

## 2020-09-29 DIAGNOSIS — Z13820 Encounter for screening for osteoporosis: Secondary | ICD-10-CM | POA: Diagnosis not present

## 2020-09-29 DIAGNOSIS — E785 Hyperlipidemia, unspecified: Secondary | ICD-10-CM | POA: Diagnosis not present

## 2020-09-29 DIAGNOSIS — E039 Hypothyroidism, unspecified: Secondary | ICD-10-CM | POA: Diagnosis not present

## 2020-09-29 DIAGNOSIS — R7301 Impaired fasting glucose: Secondary | ICD-10-CM | POA: Diagnosis not present

## 2020-10-03 DIAGNOSIS — R82998 Other abnormal findings in urine: Secondary | ICD-10-CM | POA: Diagnosis not present

## 2020-10-03 DIAGNOSIS — Z1331 Encounter for screening for depression: Secondary | ICD-10-CM | POA: Diagnosis not present

## 2020-10-03 DIAGNOSIS — Z0000 Encounter for general adult medical examination without abnormal findings: Secondary | ICD-10-CM | POA: Diagnosis not present

## 2020-10-03 DIAGNOSIS — I10 Essential (primary) hypertension: Secondary | ICD-10-CM | POA: Diagnosis not present

## 2020-10-03 DIAGNOSIS — E039 Hypothyroidism, unspecified: Secondary | ICD-10-CM | POA: Diagnosis not present

## 2020-10-03 DIAGNOSIS — Z1212 Encounter for screening for malignant neoplasm of rectum: Secondary | ICD-10-CM | POA: Diagnosis not present

## 2020-10-26 DIAGNOSIS — J309 Allergic rhinitis, unspecified: Secondary | ICD-10-CM | POA: Diagnosis not present

## 2020-10-26 DIAGNOSIS — J209 Acute bronchitis, unspecified: Secondary | ICD-10-CM | POA: Diagnosis not present

## 2020-11-22 ENCOUNTER — Telehealth: Payer: Self-pay | Admitting: Genetic Counselor

## 2020-11-22 NOTE — Telephone Encounter (Signed)
Returned April Ray's phone call regarding CPT codes for insurance authorization of genetic testing. Provided CPT codes for both genetic testing and genetic counseling. Scheduled a blood draw for 3:45pm on Thursday, right before her genetic counseling appointment.

## 2020-11-24 ENCOUNTER — Other Ambulatory Visit: Payer: Self-pay

## 2020-11-24 ENCOUNTER — Inpatient Hospital Stay (HOSPITAL_BASED_OUTPATIENT_CLINIC_OR_DEPARTMENT_OTHER): Payer: Self-pay | Admitting: Genetic Counselor

## 2020-11-24 ENCOUNTER — Inpatient Hospital Stay: Payer: Self-pay | Attending: Genetic Counselor

## 2020-11-24 DIAGNOSIS — Z8042 Family history of malignant neoplasm of prostate: Secondary | ICD-10-CM

## 2020-11-24 DIAGNOSIS — Z8481 Family history of carrier of genetic disease: Secondary | ICD-10-CM

## 2020-11-24 DIAGNOSIS — Z800 Family history of malignant neoplasm of digestive organs: Secondary | ICD-10-CM

## 2020-11-24 DIAGNOSIS — Z803 Family history of malignant neoplasm of breast: Secondary | ICD-10-CM

## 2020-11-24 LAB — GENETIC SCREENING ORDER

## 2020-11-29 DIAGNOSIS — Z1231 Encounter for screening mammogram for malignant neoplasm of breast: Secondary | ICD-10-CM | POA: Diagnosis not present

## 2020-12-05 ENCOUNTER — Encounter: Payer: Self-pay | Admitting: Genetic Counselor

## 2020-12-05 DIAGNOSIS — Z8042 Family history of malignant neoplasm of prostate: Secondary | ICD-10-CM | POA: Insufficient documentation

## 2020-12-05 DIAGNOSIS — Z800 Family history of malignant neoplasm of digestive organs: Secondary | ICD-10-CM | POA: Insufficient documentation

## 2020-12-05 DIAGNOSIS — Z8481 Family history of carrier of genetic disease: Secondary | ICD-10-CM | POA: Insufficient documentation

## 2020-12-05 NOTE — Progress Notes (Signed)
REFERRING PROVIDER: No referring provider defined for this encounter.  PRIMARY PROVIDER:  No primary care provider on file.  PRIMARY REASON FOR VISIT:  1. Family history of gene mutation   2. Family history of pancreatic cancer   3. Family history of prostate cancer   4. Family history of colon cancer   5. Family history of breast cancer in first degree relative      HISTORY OF PRESENT ILLNESS:   April Ray, a 50 y.o. female, was seen for a [place] cancer genetics consultation due to a family history of a known PALB2 mutation.  April Ray presents to clinic today with her mother to discuss the possibility of a hereditary predisposition to cancer, genetic testing, and to further clarify her future cancer risks, as well as potential cancer risks for family members.   April Ray does not have a personal history of cancer.     Past Medical History:  Diagnosis Date  . ANXIETY 04/07/2007  . DEPRESSION 04/07/2007  . Family history of breast cancer in first degree relative 12/18/2012   mother  . FATIGUE 03/03/2008  . Headache(784.0) 03/03/2008  . HYPERLIPIDEMIA 03/03/2008  . Hypertension   . Impaired glucose tolerance 08/14/2011  . MIGRAINE HEADACHE 04/03/2007  . Post-menopausal 12/18/2012    No past surgical history on file.  Social History   Socioeconomic History  . Marital status: Married    Spouse name: Not on file  . Number of children: 2  . Years of education: Not on file  . Highest education level: Not on file  Occupational History  . Occupation: Account Education administrator  Tobacco Use  . Smoking status: Never Smoker  . Smokeless tobacco: Never Used  Substance and Sexual Activity  . Alcohol use: No    Comment: social  . Drug use: No  . Sexual activity: Yes    Birth control/protection: Post-menopausal  Other Topics Concern  . Not on file  Social History Narrative  . Not on file   Social Determinants of Health   Financial Resource Strain: Not on file  Food Insecurity:  Not on file  Transportation Needs: Not on file  Physical Activity: Not on file  Stress: Not on file  Social Connections: Not on file     FAMILY HISTORY:  We obtained a detailed, 4-generation family history.  Significant diagnoses are listed below: Family History  Problem Relation Age of Onset  . Diabetes Other        early onset DM 30's (non obese)  . Breast cancer Mother 58       diagnosed again at age 37  . Other Mother        PALB2 gene mutation (p.Q822*)  . Prostate cancer Father 62       Prostate Cancer  . Heart disease Other   . Cancer Other        Lung Cancer-Grandfather  . Hypertension Other        Grandmother  . Pancreatic cancer Maternal Aunt 79  . Prostate cancer Paternal Uncle 68  . Lung cancer Maternal Grandfather 33       smoker  . Colon cancer Paternal Grandmother 66   April Ray has one daughter (age 53) and one son (age 33). She does not have any siblings.  April Ray mother is alive at age 11 and has a history of breast cancer first diagnosed at age 41, and then again at age 75, as well as multiple non-melanoma skin cancers. Her mother had  positive genetic testing for a mutation in the PALB2 gene called p.Q822*. There was one maternal aunt, who died from pancreatic caner at age 45. There is no known cancer among maternal cousins. April Ray maternal grandmother died at age 2 without cancer. Her maternal grandfather died at age 83 with lung cancer.  April Ray father is alive at age 19 and was diagnosed with prostate cancer at age 30. There are two paternal uncles. One uncle was diagnosed with prostate cancer around age 41. There is no known cancer among paternal cousins. April Ray's paternal grandmother died at age 54 from colon cancer (diagnosed age 1). Her paternal grandfather died at age 56 without cancer.  April Ray is aware of previous family history of genetic testing for hereditary cancer risks. Patient's ancestors are of unknown descent. There  is no reported Ashkenazi Jewish ancestry. There is no known consanguinity.  GENETIC COUNSELING ASSESSMENT: April Ray is a 50 y.o. female with a family history of a known PALB2 mutation, breast cancer, pancreatic cancer, lung cancer, prostate cancer, and colon cancer. We, therefore, discussed and recommended the following at today's visit.   DISCUSSION:  We discussed that April Ray has a 50% (1 in 2) chance to also have the PALB2 variant that was discovered in her mother. We reviewed the cancer risks that are associated with PALB2 mutations, including an increased risk of female breast cancer, female breast cancer, ovarian cancer, and pancreatic cancer. Individuals with a PALB2 mutation may opt for increased cancer screening and/or risk-reduction strategies for associated cancer risks per the NCCN guidelines.  We discussed that testing is beneficial for multiple reasons including knowing about potential cancer risks, identifying screening and risk-reduction options that may be appropriate, and to understand if other family members could be at risk for cancer and allow them to undergo genetic testing. We reviewed the characteristics, features and inheritance patterns of hereditary cancer syndromes. We also discussed genetic testing, including the appropriate family members to test, the process of testing, insurance coverage, genetic discrimination, and turn-around-time for results. We discussed the implications of a negative vs a positive result.   We recommended April Ray pursue genetic testing for the known familial variant in [place], called p.Q822*, through [facility]. Because April Ray's mother had genetic testing that identified a pathogenic variant through [facility] within the past 90 days, testing for April Ray will be free of charge if carried out through [facility].   We discussed that some people do not want to undergo genetic testing due to fear of genetic discrimination. A  federal law called the Genetic Information Non-Discrimination Act (GINA) of 2008 helps protect individuals against genetic discrimination based on their genetic test results. It impacts both health insurance and employment. With health insurance, it protects against increased premiums, being kicked off insurance or being forced to take a test in order to be insured. For employment it protects against hiring, firing and promoting decisions based on genetic test results. Health status due to a cancer diagnosis is not protected under GINA. Importantly, other types of insurance such as life, disability, and long-term care insurance is not protected under GINA.   PLAN: April Ray did not wish to proceed with genetic testing today, as she would like some time to consider insurance coverage for insurance types not covered by GINA. She will plan to reach out to [place] when she is ready to move forward with genetic testing.  April Ray questions were answered to her satisfaction today. Our contact  information was provided should additional questions or concerns arise. Thank you for the referral and allowing [place] to share in the care of your patient.   Clint Guy, [place], [facility] Licensed, Certified Dispensing [email or web address] Phone: [phone]  The patient was seen for a total of 40 minutes in face-to-face genetic counseling.  This patient was discussed with Drs. Magrinat, Lindi Adie and/or Burr Medico who agrees with the above.    _______________________________________________________________________ For Office Staff:  Number of people involved in session: 1 Was an Intern/ student involved with case: no

## 2020-12-14 DIAGNOSIS — E785 Hyperlipidemia, unspecified: Secondary | ICD-10-CM | POA: Diagnosis not present

## 2020-12-14 DIAGNOSIS — E039 Hypothyroidism, unspecified: Secondary | ICD-10-CM | POA: Diagnosis not present

## 2020-12-14 DIAGNOSIS — R5383 Other fatigue: Secondary | ICD-10-CM | POA: Diagnosis not present

## 2021-01-04 ENCOUNTER — Telehealth: Payer: Self-pay | Admitting: Genetic Counselor

## 2021-01-04 NOTE — Telephone Encounter (Signed)
LVM requesting April Ray call back to confirm whether she would like to proceed with genetic testing.

## 2021-01-04 NOTE — Telephone Encounter (Signed)
April Ray provided consent to proceed with genetic testing for the known familial variant in the PALB2 gene. We will call her in approximately two-three weeks once the result is available.

## 2021-01-16 DIAGNOSIS — Z1589 Genetic susceptibility to other disease: Secondary | ICD-10-CM | POA: Insufficient documentation

## 2021-01-16 DIAGNOSIS — Z1379 Encounter for other screening for genetic and chromosomal anomalies: Secondary | ICD-10-CM | POA: Insufficient documentation

## 2021-01-16 DIAGNOSIS — Z1509 Genetic susceptibility to other malignant neoplasm: Secondary | ICD-10-CM | POA: Insufficient documentation

## 2021-01-17 ENCOUNTER — Telehealth: Payer: Self-pay | Admitting: Genetic Counselor

## 2021-01-17 NOTE — Telephone Encounter (Signed)
LVM that her genetic test results are available and requested that she call back to discuss them.  

## 2021-01-19 NOTE — Telephone Encounter (Signed)
LVM that her genetic test results are available and requested that she call back to discuss them.  

## 2021-01-20 ENCOUNTER — Encounter: Payer: Self-pay | Admitting: Genetic Counselor

## 2021-01-20 NOTE — Telephone Encounter (Signed)
Revealed positive genetic test results - April Ray inherited the familial PALB2 mutation called p.Q822* (c.2464C>T). Briefly reviewed cancer risks and recommendations. April Ray is interested in scheduling a follow-up genetic counseling appointment to review these risks in more detail, but would like to discuss appointment times with her husband. She will call back next week to set up the appointment. In the meantime, we will initiate referrals to the high risk breast clinic to discuss breast cancer screening, and  GI to discuss pancreatic cancer screening.

## 2021-01-20 NOTE — Telephone Encounter (Signed)
LVM that her genetic test results are available and requested that she call back to discuss them. Also provided alternative number she can call to speak with a genetic counselor on Monday or Tuesday when I am out of the office.

## 2021-02-01 ENCOUNTER — Encounter: Payer: Self-pay | Admitting: Genetic Counselor

## 2021-02-02 ENCOUNTER — Other Ambulatory Visit: Payer: Self-pay

## 2021-02-02 ENCOUNTER — Inpatient Hospital Stay: Payer: BC Managed Care – PPO | Attending: Genetic Counselor | Admitting: Genetic Counselor

## 2021-02-02 DIAGNOSIS — Z1509 Genetic susceptibility to other malignant neoplasm: Secondary | ICD-10-CM

## 2021-02-02 DIAGNOSIS — Z1379 Encounter for other screening for genetic and chromosomal anomalies: Secondary | ICD-10-CM

## 2021-02-02 DIAGNOSIS — Z1589 Genetic susceptibility to other disease: Secondary | ICD-10-CM

## 2021-02-02 NOTE — Progress Notes (Signed)
GENETIC TEST RESULTS   Patient Name: April Ray Patient Age: 50 y.o. Encounter Date: 02/02/2021    April Ray was seen in the [facility] clinic on 11/24/2020 due to a family history of a known PALB2 gene mutation and concern regarding a hereditary predisposition to cancer in the family. Please refer to the prior Genetics clinic note for more information regarding April Ray's medical and family histories and our assessment at the time.   FAMILY HISTORY:  We obtained a detailed, 4-generation family history.  Significant diagnoses are listed below: Family History  Problem Relation Age of Onset   Diabetes Other        early onset DM 60's (non obese)   Breast cancer Mother 77       diagnosed again at age 89   Other Mother        PALB2 gene mutation (p.Q822*)   Prostate cancer Father 54       Prostate Cancer   Heart disease Other    Cancer Other        Lung Cancer-Grandfather   Hypertension Other        Grandmother   Pancreatic cancer Maternal Aunt 79   Prostate cancer Paternal Uncle 31   Lung cancer Maternal Grandfather 79       smoker   Colon cancer Paternal Grandmother 93   April Ray has one daughter (age 63) and one son (age 40). She does not have any siblings.   April Ray mother is alive at age 51 and has a history of breast cancer first diagnosed at age 31, and then again at age 70, as well as multiple non-melanoma skin cancers. Her mother had positive genetic testing for a mutation in the PALB2 gene called p.Q822*. There was one maternal aunt, who died from pancreatic caner at age 42. There is no known cancer among maternal cousins. April Ray maternal grandmother died at age 74 without cancer. Her maternal grandfather died at age 79 with lung cancer.   April Ray father is alive at age 5 and was diagnosed with prostate cancer at age 60. There are two paternal uncles. One uncle was diagnosed with prostate cancer around age 3. There is no known cancer among  paternal cousins. April Ray's paternal grandmother died at age 51 from colon cancer (diagnosed age 78). Her paternal grandfather died at age 61 without cancer.   April Ray is aware of previous family history of genetic testing for hereditary cancer risks. Patient's ancestors are of unknown descent. There is no reported Ashkenazi Jewish ancestry. There is no known consanguinity.  GENETIC TESTING: Specific Site Analysis of the PALB2 gene reported on 01/16/2021 through [facility]. A single, heterozygous pathogenic variant was detected in the PALB2 gene called p.Q822* (c.2464C>T).   The test report has been scanned into EPIC and located under the Molecular Pathology section of the Results Review tab.  A portion of the result report is included below for reference.     PALB2 GENE DISCUSSION:  The risk of breast cancer in women with a single pathogenic PALB2 variant is 33-58% by age 77, with higher risks among those with a greater number of relatives with breast cancer (PMID: 62130865, 78469629, 52841324, 40102725). One study found the risk of developing contralateral breast cancer is approximately 10% within five years after the initial diagnosis of breast cancer among individuals with a pathogenic variant in [place] (PMID: 36644034).  For both men and women, there is also an increased risk for pancreatic  cancer, however, specific risk figures are not yet established (PMID: 97026378, 58850277, 41287867). Additional data suggests an increased risk of ovarian cancer (PMID: 67209470, 96283662) and female breast cancer (PMID: 94765465, 03546568, 12751700), although this evidence is limited and emerging.  PALB2 Inheritance:  Hereditary predisposition to cancer due to pathogenic variants in the PALB2 gene has autosomal dominant inheritance. This means that an individual with a pathogenic variant has a 50% chance of passing the condition on to their offspring. Once a pathogenic mutation is detected in an  individual, it is possible to identify at-risk relatives who can pursue testing for this specific familial variant. Many cases are inherited from a parent, but some cases may occur spontaneously (i.e., an individual with a pathogenic variant who has parents who do not have it).  MEDICAL MANAGEMENT: The Artist (NCCN) has published screening and surveillance guidelines for individuals with a single pathogenic variant in PALB2 (Genetic/Familial High-Risk Assessment: Breast, Ovarian, and Pancreatic, Version 2.2022):  Female Breast Cancer Risk:  Annual mammogram with consideration of tomosynthesis beginning at age 27 Annual breast MRI with contrast beginning at age 66 Discuss option of prophylactic risk-reducing mastectomy  We have April Ray to the [facility] to discuss breast cancer risk management options.   Ovarian Cancer Risk: Evidence is insufficient to recommend risk-reducing bilateral salpingo-oophorectomy, manage based on family history   Pancreatic Cancer Risk:  Pancreatic cancer screening in individuals with a family history of pancreatic cancer in a first-degree or second-degree relative from the same side of the family as the PALB2 mutation Ideally, screening should be performed in experienced centers utilizing a multidisciplinary approach under research conditions. Recommended screening includes annual endoscopic ultrasound and/or MRI of the pancreas starting at age 39 or 42 years younger than the earliest age of pancreatic cancer diagnosis in the family. Because April Ray has a family history of pancreatic cancer in her maternal aunt, we recommend that she consider pancreatic cancer screening. We previously referred her to Dr. Bryan Lemma at [facility]; however, she is currently seen by Dr. Therisa Doyne at [facility] gastroenterology for her GI care. This note will be copied to that practice in order to set up the appropriate follow up.  Female  Breast Cancer Risk: There are currently no specific management recommendations for female breast cancer risk in PALB2 mutation carriers. Can consider clinical breast exams with a primary care provider, and self-breast exams.  These guidelines are based on current NCCN guidelines. These guidelines are continually updated and subject to change. They should be directly referenced for future medical management.  Overall cancer risk assessment incorporates additional factors including personal medical history, family history, and any available genetic information that may result in a personalized plan for cancer prevention and surveillance.  FAMILY MEMBERS: It is important that all of April Ray's relatives (both men and women) know of the presence of this gene mutation. Site-specific genetic testing can sort out who in the family is at risk and who is not. Knowing if a PALB2 pathogenic variant is present is advantageous. At-risk relatives can be identified, enabling pursuit of a diagnostic evaluation. Further, the available information regarding hereditary cancer susceptibility genes is constantly evolving and more clinically relevant data regarding PALB2 are likely to become available in the near future. Awareness of this cancer predisposition encourages patients and their providers to inform at-risk family members, to diligently follow recommended screening protocols, and to be vigilant in maintaining close and regular contact with their local genetics clinic  in anticipation of new information.  April Ray's children each have a 50% (1 in 2) chance to have inherited this mutation. We recommend they have genetic counseling and testing for the familial mutation when they reach adulthood.    Additionally, individuals with a pathogenic variant in PALB2 are carriers of a rare genetic condition called Fanconi anemia. Fanconi anemia is an autosomal recessive disorder that is characterized by bone marrow failure and  variable presentation of anomalies, including short stature, abnormal skin pigmentation, abnormal thumbs, malformations of the skeletal and central nervous systems, and developmental delay. Risks for leukemia and early onset solid tumors are significantly elevated. For there to be a risk of Fanconi anemia in offspring, both parents would each have to have a single pathogenic variant in [place]; in such a case, the chance of having an affected child is 25%.  SUPPORT AND RESOURCES: If April Ray is interested in PALB2-information and support, there are two groups, Facing Our Risk (www.facingourrisk.org) and Bright Pink (www.brightpink.org) which some people have found useful. They provide opportunities to speak with other individuals from high-risk families. To locate genetic counselors in other cities, visit the website www.FindAGeneticCounselor.com and search for a counselor by zip code.  We encouraged April Ray to remain in contact with [place] on an annual basis so we can update her personal and family histories, and let her know of advances in cancer genetics that may benefit the family. Our contact number was provided. April Ray questions were answered to her satisfaction today, and she knows she is welcome to call anytime with additional questions.    Clint Guy, [place], [facility] Licensed, Certified Dispensing [email or web address] Phone: [phone]  The patient was seen for a total of 25 minutes in face-to-face genetic counseling. The patient was seen with her husband, Kasandra Knudsen.

## 2021-02-03 ENCOUNTER — Telehealth: Payer: Self-pay | Admitting: Genetic Counselor

## 2021-02-03 NOTE — Telephone Encounter (Signed)
LVM that Dr. Paulita Fujita with [facility] is able to perform pancreatic cancer screening. Encouraged her to reach out to [facility] to set up a consultation.

## 2021-03-06 ENCOUNTER — Telehealth: Payer: Self-pay | Admitting: Genetic Counselor

## 2021-03-06 DIAGNOSIS — Z1509 Genetic susceptibility to other malignant neoplasm: Secondary | ICD-10-CM | POA: Diagnosis not present

## 2021-03-06 DIAGNOSIS — Z01419 Encounter for gynecological examination (general) (routine) without abnormal findings: Secondary | ICD-10-CM | POA: Diagnosis not present

## 2021-03-06 DIAGNOSIS — Z1501 Genetic susceptibility to malignant neoplasm of breast: Secondary | ICD-10-CM | POA: Diagnosis not present

## 2021-03-06 DIAGNOSIS — Z6829 Body mass index (BMI) 29.0-29.9, adult: Secondary | ICD-10-CM | POA: Diagnosis not present

## 2021-03-06 NOTE — Telephone Encounter (Signed)
April Ray called regarding the previous referral to [facility] for pancreatic cancer screening. She did not return their scheduling phone call, but she is now interested in moving forward with scheduling an appointment. Provided her [employer] phone number [phone]). I will also reach out to  to determine whether the referral will need to be sent again.

## 2021-03-07 ENCOUNTER — Encounter: Payer: Self-pay | Admitting: Gastroenterology

## 2021-03-07 DIAGNOSIS — Z01419 Encounter for gynecological examination (general) (routine) without abnormal findings: Secondary | ICD-10-CM | POA: Diagnosis not present

## 2021-03-08 ENCOUNTER — Other Ambulatory Visit: Payer: Self-pay | Admitting: Obstetrics and Gynecology

## 2021-03-08 DIAGNOSIS — Z1501 Genetic susceptibility to malignant neoplasm of breast: Secondary | ICD-10-CM

## 2021-03-09 DIAGNOSIS — Z1501 Genetic susceptibility to malignant neoplasm of breast: Secondary | ICD-10-CM | POA: Diagnosis not present

## 2021-03-23 ENCOUNTER — Ambulatory Visit
Admission: RE | Admit: 2021-03-23 | Discharge: 2021-03-23 | Disposition: A | Discharge status: Home / Self Care | Payer: BC Managed Care – PPO | Source: Ambulatory Visit | Attending: Obstetrics and Gynecology | Admitting: Obstetrics and Gynecology

## 2021-03-23 ENCOUNTER — Other Ambulatory Visit: Payer: Self-pay

## 2021-03-23 DIAGNOSIS — N6489 Other specified disorders of breast: Secondary | ICD-10-CM | POA: Diagnosis not present

## 2021-03-23 DIAGNOSIS — Z1501 Genetic susceptibility to malignant neoplasm of breast: Secondary | ICD-10-CM

## 2021-03-23 DIAGNOSIS — Z853 Personal history of malignant neoplasm of breast: Secondary | ICD-10-CM | POA: Diagnosis not present

## 2021-03-23 MED ORDER — GADOBUTROL 1 MMOL/ML IV SOLN
10.0000 mL | Freq: Once | INTRAVENOUS | Status: AC | PRN
  Administered 2021-03-23: 10 mL via INTRAVENOUS

## 2021-03-31 ENCOUNTER — Other Ambulatory Visit: Payer: Self-pay

## 2021-03-31 ENCOUNTER — Ambulatory Visit (INDEPENDENT_AMBULATORY_CARE_PROVIDER_SITE_OTHER): Payer: BC Managed Care – PPO | Admitting: Gastroenterology

## 2021-03-31 ENCOUNTER — Encounter: Payer: Self-pay | Admitting: Gastroenterology

## 2021-03-31 ENCOUNTER — Other Ambulatory Visit (INDEPENDENT_AMBULATORY_CARE_PROVIDER_SITE_OTHER): Payer: BC Managed Care – PPO

## 2021-03-31 VITALS — BP 124/84 | HR 67 | Ht 69.0 in | Wt 194.0 lb

## 2021-03-31 DIAGNOSIS — Z1289 Encounter for screening for malignant neoplasm of other sites: Secondary | ICD-10-CM

## 2021-03-31 DIAGNOSIS — Z800 Family history of malignant neoplasm of digestive organs: Secondary | ICD-10-CM | POA: Diagnosis not present

## 2021-03-31 DIAGNOSIS — Z1211 Encounter for screening for malignant neoplasm of colon: Secondary | ICD-10-CM | POA: Diagnosis not present

## 2021-03-31 LAB — HEMOGLOBIN A1C: Hgb A1c MFr Bld: 6 % (ref 4.6–6.5)

## 2021-03-31 NOTE — Progress Notes (Signed)
Chief Complaint: Pancreatic Cancer Screening  Referring Provider:     Clint Guy   HPI:     April Ray is a 50 y.o. female with a history of anxiety, depression, HTN, HLD, migraines, impaired fasting glucose, referred to the Gastroenterology Clinic for evaluation of Pancreatic Cancer Screening.  She was most recently seen in the [facility] on 02/02/2021 for follow-up of known family history of PALB2 gene mutation.  Please refer to [facility] note dated 11/24/2020 for full family history.  Family history notable for the following: - Mother: Breast cancer age 50 (and diagnosed again at age 65), PALB2 gene mutation p.Q822*, nonmelanoma skin cancer - Father: Prostate cancer - Maternal grandfather: Lung cancer - Maternal aunt: Pancreatic cancer at age 54 - Paternal grandmother: Colon cancer at age 34  Genetic testing on 01/16/2021: A single, heterozygous pathogenic variant was detected in the PALB2 gene called p.Q822* (c.[id number]).   No prior EGD or Colonoscopy.   She is otherwise without any GI symptoms.  No complaints today.  No prior known personal history of hepatic or pancreaticobiliary disease.  No history of jaundice, ascites, icteric sclera.  Hent  Past Medical History:  Diagnosis Date   ANXIETY 04/07/2007   DEPRESSION 04/07/2007   Family history of breast cancer in first degree relative 12/18/2012   mother   Family history of colon cancer    Family history of gene mutation    Family history of pancreatic cancer    Family history of prostate cancer    FATIGUE 03/03/2008   Headache(784.0) 03/03/2008   HYPERLIPIDEMIA 03/03/2008   Hypertension    Impaired glucose tolerance 08/14/2011   MIGRAINE HEADACHE 04/03/2007   Post-menopausal 12/18/2012     No past surgical history on file. Family History  Problem Relation Age of Onset   Diabetes Other        early onset DM 53's (non obese)   Breast cancer Mother 33       diagnosed again at age 69   Other  Mother        PALB2 gene mutation (p.Q822*)   Prostate cancer Father 58       Prostate Cancer   Heart disease Other    Cancer Other        Lung Cancer-Grandfather   Hypertension Other        Grandmother   Pancreatic cancer Maternal Aunt 79   Prostate cancer Paternal Uncle 59   Lung cancer Maternal Grandfather 79       smoker   Colon cancer Paternal Grandmother 93   Social History   Tobacco Use   Smoking status: Never   Smokeless tobacco: Never  Substance Use Topics   Alcohol use: No    Comment: social   Drug use: No   Current Outpatient Medications  Medication Sig Dispense Refill   lisinopril (PRINIVIL,ZESTRIL) 10 MG tablet Take 1 tablet (10 mg total) by mouth daily. 90 tablet 3   venlafaxine XR (EFFEXOR-XR) 75 MG 24 hr capsule TAKE 1 CAPSULE (75 MG TOTAL) BY MOUTH DAILY. 90 capsule 3   No current facility-administered medications for this visit.   No Known Allergies   Review of Systems: All systems reviewed and negative except where noted in HPI.     Physical Exam:    Wt Readings from Last 3 Encounters:  04/14/14 171 lb (77.6 kg)  09/18/13 177 lb (80.3 kg)  12/18/12 171 lb (77.6  kg)    LMP 04/23/2011  Constitutional:  Pleasant, in no acute distress. Psychiatric: Normal mood and affect. Behavior is normal. EENT: Pupils normal.  Conjunctivae are normal. No scleral icterus. Neurological: Alert and oriented to person place and time. Skin: Skin is warm and dry. No rashes noted.   ASSESSMENT AND PLAN;   1) Family History of Pancreatic Cancer: 2) Genetic Mutation We discussed most recent [place] and AGA guidelines at length today.  We both believe that she is at elevated risk and therefore fits Pancreatic Cancer screening protocol based on her PALB2 gene mutation and family history of mother with an associated cancer (breast cancer) and maternal aunt with Pancreatic Cancer.   Recommended age to start surveillance varies by gene mutation  status and family history, as follows: -BRCA2, ATM, PALB 2, BRCA1, MLH1/MSH2-start at age 73 or 89, or 33 years younger than the youngest affected blood relative (mother diagnosed at age 36)  Screening techniques include the following: -Baseline: MRI/MRCP plus EUS plus fasting blood glucose and/or hemoglobin A1c -Follow-up/surveillance phase: Alternate MRI/MRCP and EUS plus routine fasting blood glucose and/or hemoglobin A1c -If concerning features on imaging, check CA 19-9 -EUS with FNA for solid lesions ?5 mm, cystic lesions with worrisome features, or asymptomatic main pancreatic duct (MPD) strictures (with or without mass) -CT only for solid lesions, regardless of size, or asymptomatic MPD strictures of unknown etiology (without mass)  -Screening interval every 12 months (or Q6 months per local center of excellence protocol) in patients with no abnormalities/nonconcerning abnormalities -Screening interval every 3-6 months in patients with abnormalities that are NOT suspicious for malignancy, but are concerning -EUS evaluation should be performed within 3-6 months for indeterminate lesions (abnormalities that are NOT suspicious for malignancy, but are concerning) -EUS evaluation should be performed within 3 months for high-risk lesions, if surgical resection is not planned. -Immediate surgical referral for abnormality suspicious for malignancy  -New-onset diabetes in a high-risk individual should lead to additional diagnostic studies or change in surveillance interval  -Genetic testing and counseling should be considered for familial pancreas cancer relatives who are eligible for surveillance. A positive germline mutation is associated with an increased risk of neoplastic progression and may also lead to screening for other relevant associated cancers -Participation in a registry or referral to a pancreas Center of Excellence should be pursued when possible for high-risk patients undergoing  pancreas cancer screening -The target detectable pancreatic neoplasms are resectable stage I pancreatic ductal adenocarcinoma and high-risk precursor neoplasms, such as intraductal papillary mucinous neoplasms with high-grade dysplasia and some enlarged pancreatic intraepithelial neoplasias  -We discussed the limitations and potential risks of pancreas cancer screening prior to initiating any screening program, and the patient  wishes to proceed with screening.  Plan for the following:  - Hemoglobin A1c now - MRI/MRCP now - EUS in 6 months - Follow-up in the GI clinic every 6 months - Will place in Pancreatic Cancer Screening protocol registry - Has started Breast Cancer screening protocol.  Has upcoming follow-up with Dr. Donne Hazel to discuss elevated risk of Breast Cancer - Continue follow-up in the [facility] as planned  3) Colon cancer screening - Separately, due for age-appropriate, average risk colon cancer screening - Schedule colonoscopy  The indications, risks, and benefits of colonoscopy were explained to the patient in detail. Risks include but are not limited to bleeding, perforation, adverse reaction to medications, and cardiopulmonary compromise. Sequelae include but are not limited to the possibility of surgery, hospitalization, and mortality.  The patient verbalized understanding and wished to proceed. All questions answered, referred to the scheduler and bowel prep ordered. Further recommendations pending results of the exam.    Lavena Bullion, DO, FACG  03/31/2021, 9:09 AM   Crist Infante, MD

## 2021-03-31 NOTE — Patient Instructions (Addendum)
If you are age 50 or older, your body mass index should be between 23-30. Your Body mass index is 28.65 kg/m. If this is out of the aforementioned range listed, please consider follow up with your Primary Care Provider.  If you are age 43 or younger, your body mass index should be between 19-25. Your Body mass index is 28.65 kg/m. If this is out of the aformentioned range listed, please consider follow up with your Primary Care Provider.   __________________________________________________________  The [facility] providers would like to encourage you to use [facility] to communicate with providers for non-urgent requests or questions.  Due to long hold times on the telephone, sending your provider a message by [facility] may be a faster and more efficient way to get a response.  Please allow 48 business hours for a response.  Please remember that this is for non-urgent requests.   You have been scheduled for an MRI at 04-17-2021 on 8:30am. Your appointment time is 9am. Please arrive to admitting (at main entrance of the hospital) 30 minutes prior to your appointment time for registration purposes. Please make certain not to have anything to eat or drink 6 hours prior to your test. In addition, if you have any metal in your body, have a pacemaker or defibrillator, please be sure to let your ordering physician know. This test typically takes 45 minutes to 1 hour to complete. Should you need to reschedule, please call [phone] to do so.  You have been scheduled for a colonoscopy. Please follow written instructions given to you at your visit today.  Please pick up your prep supplies at the pharmacy within the next 1-3 days. If you use inhalers (even only as needed), please bring them with you on the day of your procedure.  Please go to the lab on the 2nd floor [address] before you leave the office today.   We have given you samples of the following medication to take: Clenpiq  It was a pleasure to see  you today!  Gerrit Heck, D.O.  It was a pleasure to see you today!  Vito Cirigliano, D.O.

## 2021-04-03 ENCOUNTER — Encounter: Payer: Self-pay | Admitting: Gastroenterology

## 2021-04-11 ENCOUNTER — Encounter: Payer: Self-pay | Admitting: Certified Registered Nurse Anesthetist

## 2021-04-12 ENCOUNTER — Encounter: Payer: Self-pay | Admitting: Gastroenterology

## 2021-04-12 ENCOUNTER — Ambulatory Visit (AMBULATORY_SURGERY_CENTER): Payer: BC Managed Care – PPO | Admitting: Gastroenterology

## 2021-04-12 ENCOUNTER — Other Ambulatory Visit: Payer: Self-pay

## 2021-04-12 VITALS — BP 130/67 | HR 49 | Temp 97.1°F | Resp 12 | Ht 69.0 in | Wt 194.0 lb

## 2021-04-12 DIAGNOSIS — K641 Second degree hemorrhoids: Secondary | ICD-10-CM

## 2021-04-12 DIAGNOSIS — D127 Benign neoplasm of rectosigmoid junction: Secondary | ICD-10-CM | POA: Diagnosis not present

## 2021-04-12 DIAGNOSIS — Z1211 Encounter for screening for malignant neoplasm of colon: Secondary | ICD-10-CM

## 2021-04-12 DIAGNOSIS — K621 Rectal polyp: Secondary | ICD-10-CM | POA: Diagnosis not present

## 2021-04-12 DIAGNOSIS — D128 Benign neoplasm of rectum: Secondary | ICD-10-CM

## 2021-04-12 DIAGNOSIS — K635 Polyp of colon: Secondary | ICD-10-CM

## 2021-04-12 DIAGNOSIS — K5730 Diverticulosis of large intestine without perforation or abscess without bleeding: Secondary | ICD-10-CM

## 2021-04-12 DIAGNOSIS — Z800 Family history of malignant neoplasm of digestive organs: Secondary | ICD-10-CM

## 2021-04-12 DIAGNOSIS — D129 Benign neoplasm of anus and anal canal: Secondary | ICD-10-CM

## 2021-04-12 DIAGNOSIS — D124 Benign neoplasm of descending colon: Secondary | ICD-10-CM

## 2021-04-12 MED ORDER — SODIUM CHLORIDE 0.9 % IV SOLN: 500.0000 mL | Freq: Once | INTRAVENOUS | Status: DC

## 2021-04-12 NOTE — Patient Instructions (Signed)
YOU HAD AN ENDOSCOPIC PROCEDURE TODAY AT [facility]:   Refer to the procedure report that was given to you for any specific questions about what was found during the examination.  If the procedure report does not answer your questions, please call your gastroenterologist to clarify.  If you requested that your care partner not be given the details of your procedure findings, then the procedure report has been included in a sealed envelope for you to review at your convenience later.  YOU SHOULD EXPECT: Some feelings of bloating in the abdomen. Passage of more gas than usual.  Walking can help get rid of the air that was put into your GI tract during the procedure and reduce the bloating. If you had a lower endoscopy (such as a colonoscopy or flexible sigmoidoscopy) you may notice spotting of blood in your stool or on the toilet paper. If you underwent a bowel prep for your procedure, you may not have a normal bowel movement for a few days.  Please Note:  You might notice some irritation and congestion in your nose or some drainage.  This is from the oxygen used during your procedure.  There is no need for concern and it should clear up in a day or so.  SYMPTOMS TO REPORT IMMEDIATELY:  Following lower endoscopy (colonoscopy or flexible sigmoidoscopy):  Excessive amounts of blood in the stool  Significant tenderness or worsening of abdominal pains  Swelling of the abdomen that is new, acute  Fever of 100F or higher    For urgent or emergent issues, a gastroenterologist can be reached at any hour by calling [phone]. Do not use MyChart messaging for urgent concerns.    DIET:  We do recommend a small meal at first, but then you may proceed to your regular diet.  Drink plenty of fluids but you should avoid alcoholic beverages for 24 hours.  ACTIVITY:  You should plan to take it easy for the rest of today and you should NOT DRIVE or use heavy machinery until tomorrow (because  of the sedation medicines used during the test).    FOLLOW UP: Our staff will call the number listed on your records 48-72 hours following your procedure to check on you and address any questions or concerns that you may have regarding the information given to you following your procedure. If we do not reach you, we will leave a message.  We will attempt to reach you two times.  During this call, we will ask if you have developed any symptoms of COVID 19. If you develop any symptoms (ie: fever, flu-like symptoms, shortness of breath, cough etc.) before then, please call [phone].  If you test positive for Covid 19 in the 2 weeks post procedure, please call and report this information to [place].    If any biopsies were taken you will be contacted by phone or by letter within the next 1-3 weeks.  Please call us at [phone] if you have not heard about the biopsies in 3 weeks.    SIGNATURES/CONFIDENTIALITY: You and/or your care partner have signed paperwork which will be entered into your electronic medical record.  These signatures attest to the fact that that the information above on your After Visit Summary has been reviewed and is understood.  Full responsibility of the confidentiality of this discharge information lies with you and/or your care-partner.    Resume medications. See report for over the counter fiber supplement. Information given on polyps,diverticulosis,high fiber  diet and hemorrhoids.

## 2021-04-12 NOTE — Op Note (Signed)
[facility] Patient Name: April Ray Procedure Date: 04/12/2021 8:00 AM MRN: [id number] Endoscopist: Gerrit Heck , MD Age: 50 Referring MD:  Date of Birth: November 12, 1970 Gender: Female Account #: [id number] Procedure:                Colonoscopy Indications:              Colon cancer screening in patient at increased                            risk: Family history of colorectal cancer in 2nd                            degree relative and family history of genetic                            mutation as below. She is otherwise without active                            GI symptoms. No previous colonoscopy. Recent                            genetic testing notable for PALB2 gene mutation.                           ?" Mother: Breast cancer age 8 (and diagnosed again                            at age 50), PALB2 gene mutation p.Q822*,                            nonmelanoma skin cancer                           ?" Father: Prostate cancer                           ?" Maternal grandfather: Lung cancer                           ?" Maternal aunt: Pancreatic cancer at age 26                           ?" Paternal grandmother: Colon cancer at age 67 Medicines:                Monitored Anesthesia Care Procedure:                Pre-Anesthesia Assessment:                           - Prior to the procedure, a History and Physical                            was performed, and patient medications and  allergies were reviewed. The patient's tolerance of                            previous anesthesia was also reviewed. The risks                            and benefits of the procedure and the sedation                            options and risks were discussed with the patient.                            All questions were answered, and informed consent                            was obtained. Prior Anticoagulants: The patient has                             taken no previous anticoagulant or antiplatelet                            agents. ASA Grade Assessment: II - A patient with                            mild systemic disease. After reviewing the risks                            and benefits, the patient was deemed in                            satisfactory condition to undergo the procedure.                           After obtaining informed consent, the colonoscope                            was passed under direct vision. Throughout the                            procedure, the patient's blood pressure, pulse, and                            oxygen saturations were monitored continuously. The                            Colonoscope was introduced through the anus and                            advanced to the the terminal ileum. The colonoscopy                            was performed without difficulty. The patient  tolerated the procedure well. The quality of the                            bowel preparation was good. The terminal ileum,                            ileocecal valve, appendiceal orifice, and rectum                            were photographed. Scope In: 8:06:04 AM Scope Out: 8:28:57 AM Scope Withdrawal Time: 0 hours 18 minutes 18 seconds  Total Procedure Duration: 0 hours 22 minutes 53 seconds  Findings:                 Hemorrhoids were found on perianal exam.                           A 4 mm polyp was found in the descending colon. The                            polyp was sessile. The polyp was removed with a                            cold snare. Resection and retrieval were complete.                            Estimated blood loss was minimal.                           Two sessile polyps were found in the rectum and                            recto-sigmoid colon. The polyps were 2 to 3 mm in                            size. These polyps were removed with a cold snare.                             Resection and retrieval were complete. Estimated                            blood loss was minimal.                           Multiple small and large-mouthed diverticula were                            found in the sigmoid colon.                           Non-bleeding internal hemorrhoids were found during                            retroflexion. The hemorrhoids were small and Grade  II (internal hemorrhoids that prolapse but reduce                            spontaneously).                           The terminal ileum appeared normal. Complications:            No immediate complications. Estimated Blood Loss:     Estimated blood loss was minimal. Impression:               - Hemorrhoids found on perianal exam.                           - One 4 mm polyp in the descending colon, removed                            with a cold snare. Resected and retrieved.                           - Two 2 to 3 mm polyps in the rectum and at the                            recto-sigmoid colon, removed with a cold snare.                            Resected and retrieved.                           - Diverticulosis in the sigmoid colon.                           - Non-bleeding internal hemorrhoids.                           - The examined portion of the ileum was normal. Recommendation:           - Patient has a contact number available for                            emergencies. The signs and symptoms of potential                            delayed complications were discussed with the                            patient. Return to normal activities tomorrow.                            Written discharge instructions were provided to the                            patient.                           - Resume previous diet.                           -  Continue present medications.                           - Await pathology results.                           - Repeat colonoscopy for  surveillance based on                            pathology results.                           - Return to GI clinic PRN.                           - Use fiber, for example Citrucel, Fibercon, Konsyl                            or Metamucil.                           - Internal hemorrhoids were noted on this study and                            may be amenable to hemorrhoid band ligation. If you                            are interested in further treatment of these                            hemorrhoids with band ligation, please contact my                            clinic to set up an appointment for evaluation and                            treatment. Gerrit Heck, MD 04/12/2021 8:36:26 AM

## 2021-04-12 NOTE — Progress Notes (Signed)
Report given to PACU, vss 

## 2021-04-12 NOTE — Progress Notes (Signed)
GASTROENTEROLOGY PROCEDURE H&P NOTE   Primary Care Physician: Crist Infante, MD    Reason for Procedure:  Colon Cancer screening  Plan:    Colonoscopy  Patient is appropriate for endoscopic procedure(s) in the ambulatory ([place]) setting.  The nature of the procedure, as well as the risks, benefits, and alternatives were carefully and thoroughly reviewed with the patient. Ample time for discussion and questions allowed. The patient understood, was satisfied, and agreed to proceed.     HPI: April Ray is a 50 y.o. female who presents for colonoscopy for routine Colon Cancer screening.  No active GI symptoms.  Mother with PALB 2 gene mutation and breast cancer, and maternal aunt with Pancreatic Cancer.  Recent genetic testing revealed that she also has the same PALB 2 gene mutation.  Paternal grandmother with colon cancer at age 7.  Patient is otherwise without complaints or active issues today.  She was most recently seen by me in the Gastroenterology Clinic on 03/31/2021.  No changes in medical history in the interim.  Past Medical History:  Diagnosis Date   ANXIETY 04/07/2007   DEPRESSION 04/07/2007   Family history of breast cancer in first degree relative 12/18/2012   mother   Family history of colon cancer    Family history of gene mutation    Family history of pancreatic cancer    Family history of prostate cancer    FATIGUE 03/03/2008   Headache(784.0) 03/03/2008   HYPERLIPIDEMIA 03/03/2008   Hypertension    Impaired glucose tolerance 08/14/2011   MIGRAINE HEADACHE 04/03/2007   Post-menopausal 12/18/2012   Thyroid disease     Past Surgical History:  Procedure Laterality Date   CESAREAN SECTION      Prior to Admission medications   Medication Sig Start Date End Date Taking? Authorizing Provider  FLUoxetine (PROZAC) 20 MG capsule Take 1 capsule by mouth daily.   Yes [provider]  levothyroxine (SYNTHROID) 50 MCG tablet Take 50 mcg by mouth  daily. 03/06/21  Yes [provider]  lisinopril (ZESTRIL) 10 MG tablet Take 1 tablet by mouth daily. 04/14/14  Yes [provider]  Magnesium 500 MG TABS Take 3 tablets by mouth at bedtime. 04/18/15  Yes [provider]  thyroid (ARMOUR) 60 MG tablet Take 1 tablet by mouth daily.   Yes [provider]    Current Outpatient Medications  Medication Sig Dispense Refill   FLUoxetine (PROZAC) 20 MG capsule Take 1 capsule by mouth daily.     levothyroxine (SYNTHROID) 50 MCG tablet Take 50 mcg by mouth daily.     lisinopril (ZESTRIL) 10 MG tablet Take 1 tablet by mouth daily.     Magnesium 500 MG TABS Take 3 tablets by mouth at bedtime.     thyroid (ARMOUR) 60 MG tablet Take 1 tablet by mouth daily.     Current Facility-Administered Medications  Medication Dose Route Frequency Provider Last Rate Last Admin   0.9 %  sodium chloride infusion  500 mL Intravenous Once Sareen Randon V, DO        Allergies as of 04/12/2021   (No Known Allergies)    Family History  Problem Relation Age of Onset   Breast cancer Mother 69       diagnosed again at age 22   Other Mother        PALB2 gene mutation ([id number]*)   Prostate cancer Father 65       Prostate Cancer   Pancreatic cancer Maternal Aunt 79  Prostate cancer Paternal Uncle 87   Lung cancer Maternal Grandfather 28       smoker   Colon cancer Paternal Grandmother 93   Diabetes Other        early onset DM 57's (non obese)   Heart disease Other    Cancer Other        Lung Cancer-Grandfather   Hypertension Other        Grandmother   Esophageal cancer Neg Hx    Stomach cancer Neg Hx    Rectal cancer Neg Hx     Social History   Socioeconomic History   Marital status: Married    Spouse name: Not on file   Number of children: 2   Years of education: Not on file   Highest education level: Not on file  Occupational History   Occupation: Home maker  Tobacco Use   Smoking status: Former    Types:  Cigarettes   Smokeless tobacco: Never  Vaping Use   Vaping Use: Never used  Substance and Sexual Activity   Alcohol use: Yes    Comment: social   Drug use: No   Sexual activity: Yes    Birth control/protection: Post-menopausal  Other Topics Concern   Not on file  Social History Narrative   Not on file   Social Determinants of Health   Financial Resource Strain: Not on file  Food Insecurity: Not on file  Transportation Needs: Not on file  Physical Activity: Not on file  Stress: Not on file  Social Connections: Not on file  Intimate Partner Violence: Not on file    Physical Exam: Vital signs in last 24 hours: _0  116/74   Pulse (!) 58   Temp (!) 97.1 F (36.2 C)   Ht _1  (1.753 m)   Wt 194 lb (88 kg)   LMP 04/23/2011   SpO2 99%   BMI 28.65 kg/m  GEN: NAD EYE: Sclerae anicteric ENT: MMM CV: Non-tachycardic Pulm: CTA b/l GI: Soft, NT/ND NEURO:  Alert & Oriented x 3   Gerrit Heck, DO [facility]   04/12/2021 7:52 AM

## 2021-04-12 NOTE — Progress Notes (Signed)
Called to room to assist during endoscopic procedure.  Patient ID and intended procedure confirmed with present staff. Received instructions for my participation in the procedure from the performing physician.  

## 2021-04-12 NOTE — Progress Notes (Signed)
Pt's states no medical or surgical changes since previsit or office visit.  CW - vitals 

## 2021-04-13 DIAGNOSIS — Z1589 Genetic susceptibility to other disease: Secondary | ICD-10-CM | POA: Diagnosis not present

## 2021-04-13 DIAGNOSIS — Z1501 Genetic susceptibility to malignant neoplasm of breast: Secondary | ICD-10-CM | POA: Diagnosis not present

## 2021-04-13 DIAGNOSIS — Z803 Family history of malignant neoplasm of breast: Secondary | ICD-10-CM | POA: Diagnosis not present

## 2021-04-14 ENCOUNTER — Telehealth: Payer: Self-pay

## 2021-04-14 ENCOUNTER — Telehealth: Payer: Self-pay | Admitting: *Deleted

## 2021-04-14 NOTE — Telephone Encounter (Signed)
First follow up call attempt.  LVM. 

## 2021-04-14 NOTE — Telephone Encounter (Signed)
  Follow up Call-  Call back number 04/12/2021  Post procedure Call Back phone  # [phone]  Permission to leave phone message Yes  Some recent data might be hidden     Patient questions:  Do you have a fever, pain , or abdominal swelling? No. Pain Score  0 *  Have you tolerated food without any problems? Yes.    Have you been able to return to your normal activities? Yes.    Do you have any questions about your discharge instructions: Diet   No. Medications  No. Follow up visit  No.  Do you have questions or concerns about your Care? No.  Actions: * If pain score is 4 or above: No action needed, pain <4.Have you developed a fever since your procedure? no  2.   Have you had an respiratory symptoms (SOB or cough) since your procedure? no  3.   Have you tested positive for COVID 19 since your procedure no  4.   Have you had any family members/close contacts diagnosed with the COVID 19 since your procedure?  no   If yes to any of these questions please route to Joylene John, RN and Joella Prince, RN

## 2021-04-17 ENCOUNTER — Ambulatory Visit (HOSPITAL_COMMUNITY): Admission: RE | Admit: 2021-04-17 | Payer: BC Managed Care – PPO | Source: Ambulatory Visit

## 2021-04-17 ENCOUNTER — Other Ambulatory Visit: Payer: Self-pay | Admitting: Gastroenterology

## 2021-04-17 DIAGNOSIS — Z1289 Encounter for screening for malignant neoplasm of other sites: Secondary | ICD-10-CM

## 2021-04-17 DIAGNOSIS — Z1211 Encounter for screening for malignant neoplasm of colon: Secondary | ICD-10-CM

## 2021-04-24 ENCOUNTER — Encounter: Payer: Self-pay | Admitting: Gastroenterology

## 2021-06-12 ENCOUNTER — Other Ambulatory Visit: Payer: Self-pay | Admitting: Obstetrics and Gynecology

## 2021-06-12 DIAGNOSIS — Z4002 Encounter for prophylactic removal of ovary: Secondary | ICD-10-CM | POA: Diagnosis not present

## 2021-06-12 DIAGNOSIS — Z1502 Genetic susceptibility to malignant neoplasm of ovary: Secondary | ICD-10-CM | POA: Diagnosis not present

## 2021-06-12 DIAGNOSIS — N83332 Acquired atrophy of left ovary and fallopian tube: Secondary | ICD-10-CM | POA: Diagnosis not present

## 2021-06-12 DIAGNOSIS — N83331 Acquired atrophy of right ovary and fallopian tube: Secondary | ICD-10-CM | POA: Diagnosis not present

## 2021-07-06 DIAGNOSIS — D485 Neoplasm of uncertain behavior of skin: Secondary | ICD-10-CM | POA: Diagnosis not present

## 2021-07-14 DIAGNOSIS — G43909 Migraine, unspecified, not intractable, without status migrainosus: Secondary | ICD-10-CM | POA: Diagnosis not present

## 2021-07-14 DIAGNOSIS — R051 Acute cough: Secondary | ICD-10-CM | POA: Diagnosis not present

## 2021-07-14 DIAGNOSIS — I10 Essential (primary) hypertension: Secondary | ICD-10-CM | POA: Diagnosis not present

## 2021-09-27 DIAGNOSIS — J302 Other seasonal allergic rhinitis: Secondary | ICD-10-CM | POA: Diagnosis not present

## 2021-09-27 DIAGNOSIS — E559 Vitamin D deficiency, unspecified: Secondary | ICD-10-CM | POA: Diagnosis not present

## 2021-09-27 DIAGNOSIS — G43009 Migraine without aura, not intractable, without status migrainosus: Secondary | ICD-10-CM | POA: Diagnosis not present

## 2021-09-27 DIAGNOSIS — E039 Hypothyroidism, unspecified: Secondary | ICD-10-CM | POA: Diagnosis not present

## 2021-09-27 DIAGNOSIS — R5383 Other fatigue: Secondary | ICD-10-CM | POA: Diagnosis not present

## 2021-09-27 DIAGNOSIS — R7303 Prediabetes: Secondary | ICD-10-CM | POA: Diagnosis not present

## 2021-09-27 DIAGNOSIS — R635 Abnormal weight gain: Secondary | ICD-10-CM | POA: Diagnosis not present

## 2021-10-12 DIAGNOSIS — R5383 Other fatigue: Secondary | ICD-10-CM | POA: Diagnosis not present

## 2021-10-12 DIAGNOSIS — E349 Endocrine disorder, unspecified: Secondary | ICD-10-CM | POA: Diagnosis not present

## 2021-10-12 DIAGNOSIS — N951 Menopausal and female climacteric states: Secondary | ICD-10-CM | POA: Diagnosis not present

## 2021-10-12 DIAGNOSIS — R635 Abnormal weight gain: Secondary | ICD-10-CM | POA: Diagnosis not present

## 2021-10-17 DIAGNOSIS — G4700 Insomnia, unspecified: Secondary | ICD-10-CM | POA: Diagnosis not present

## 2021-10-17 DIAGNOSIS — E039 Hypothyroidism, unspecified: Secondary | ICD-10-CM | POA: Diagnosis not present

## 2021-10-17 DIAGNOSIS — R5383 Other fatigue: Secondary | ICD-10-CM | POA: Diagnosis not present

## 2021-10-17 DIAGNOSIS — E349 Endocrine disorder, unspecified: Secondary | ICD-10-CM | POA: Diagnosis not present

## 2021-11-09 DIAGNOSIS — R7301 Impaired fasting glucose: Secondary | ICD-10-CM | POA: Diagnosis not present

## 2021-11-09 DIAGNOSIS — E039 Hypothyroidism, unspecified: Secondary | ICD-10-CM | POA: Diagnosis not present

## 2021-11-09 DIAGNOSIS — E785 Hyperlipidemia, unspecified: Secondary | ICD-10-CM | POA: Diagnosis not present

## 2021-11-16 DIAGNOSIS — Z1331 Encounter for screening for depression: Secondary | ICD-10-CM | POA: Diagnosis not present

## 2021-11-16 DIAGNOSIS — R82998 Other abnormal findings in urine: Secondary | ICD-10-CM | POA: Diagnosis not present

## 2021-11-16 DIAGNOSIS — I10 Essential (primary) hypertension: Secondary | ICD-10-CM | POA: Diagnosis not present

## 2021-11-16 DIAGNOSIS — Z1339 Encounter for screening examination for other mental health and behavioral disorders: Secondary | ICD-10-CM | POA: Diagnosis not present

## 2021-11-16 DIAGNOSIS — R059 Cough, unspecified: Secondary | ICD-10-CM | POA: Diagnosis not present

## 2021-11-16 DIAGNOSIS — Z0000 Encounter for general adult medical examination without abnormal findings: Secondary | ICD-10-CM | POA: Diagnosis not present

## 2021-11-16 DIAGNOSIS — E039 Hypothyroidism, unspecified: Secondary | ICD-10-CM | POA: Diagnosis not present

## 2021-11-20 ENCOUNTER — Other Ambulatory Visit: Payer: Self-pay | Admitting: Internal Medicine

## 2021-11-20 DIAGNOSIS — Z1509 Genetic susceptibility to other malignant neoplasm: Secondary | ICD-10-CM

## 2021-12-05 ENCOUNTER — Ambulatory Visit
Admission: RE | Admit: 2021-12-05 | Discharge: 2021-12-05 | Disposition: A | Discharge status: Home / Self Care | Payer: BC Managed Care – PPO | Source: Ambulatory Visit | Attending: Internal Medicine | Admitting: Internal Medicine

## 2021-12-05 DIAGNOSIS — Z1509 Genetic susceptibility to other malignant neoplasm: Secondary | ICD-10-CM

## 2021-12-05 MED ORDER — GADOBENATE DIMEGLUMINE 529 MG/ML IV SOLN
18.0000 mL | Freq: Once | INTRAVENOUS | Status: AC | PRN
  Administered 2021-12-05: 18 mL via INTRAVENOUS

## 2022-03-07 DIAGNOSIS — M792 Neuralgia and neuritis, unspecified: Secondary | ICD-10-CM | POA: Diagnosis not present

## 2022-05-24 DIAGNOSIS — L400 Psoriasis vulgaris: Secondary | ICD-10-CM | POA: Diagnosis not present

## 2022-05-24 DIAGNOSIS — B078 Other viral warts: Secondary | ICD-10-CM | POA: Diagnosis not present

## 2022-06-06 ENCOUNTER — Other Ambulatory Visit: Payer: Self-pay | Admitting: Internal Medicine

## 2022-06-06 DIAGNOSIS — I10 Essential (primary) hypertension: Secondary | ICD-10-CM | POA: Diagnosis not present

## 2022-06-06 DIAGNOSIS — D72810 Lymphocytopenia: Secondary | ICD-10-CM | POA: Diagnosis not present

## 2022-06-06 DIAGNOSIS — Z1501 Genetic susceptibility to malignant neoplasm of breast: Secondary | ICD-10-CM

## 2022-06-06 DIAGNOSIS — R7301 Impaired fasting glucose: Secondary | ICD-10-CM | POA: Diagnosis not present

## 2022-06-06 DIAGNOSIS — E039 Hypothyroidism, unspecified: Secondary | ICD-10-CM | POA: Diagnosis not present

## 2022-09-17 DIAGNOSIS — J0100 Acute maxillary sinusitis, unspecified: Secondary | ICD-10-CM | POA: Diagnosis not present

## 2022-09-17 DIAGNOSIS — R051 Acute cough: Secondary | ICD-10-CM | POA: Diagnosis not present

## 2022-09-17 DIAGNOSIS — R5383 Other fatigue: Secondary | ICD-10-CM | POA: Diagnosis not present

## 2022-09-17 DIAGNOSIS — Z1152 Encounter for screening for COVID-19: Secondary | ICD-10-CM | POA: Diagnosis not present

## 2022-09-17 DIAGNOSIS — J029 Acute pharyngitis, unspecified: Secondary | ICD-10-CM | POA: Diagnosis not present

## 2022-09-17 DIAGNOSIS — R062 Wheezing: Secondary | ICD-10-CM | POA: Diagnosis not present

## 2022-11-21 DIAGNOSIS — D485 Neoplasm of uncertain behavior of skin: Secondary | ICD-10-CM | POA: Diagnosis not present

## 2022-11-21 DIAGNOSIS — B078 Other viral warts: Secondary | ICD-10-CM | POA: Diagnosis not present

## 2022-12-03 DIAGNOSIS — E28319 Asymptomatic premature menopause: Secondary | ICD-10-CM | POA: Diagnosis not present

## 2022-12-06 IMAGING — MR MR ABDOMEN WO/W CM MRCP
15 of 19 series · 36 of 48 positions shown · IV contrast (multihance)
Comparison: None Available.

CLINICAL DATA: Positive genetic testing.

EXAM:
MRI ABDOMEN WITHOUT AND WITH CONTRAST (INCLUDING MRCP)
TECHNIQUE: Multiplanar multisequence MR imaging of the abdomen was performed
both before and after the administration of intravenous contrast.
Heavily T2-weighted images of the biliary and pancreatic ducts were
obtained, and three-dimensional MRCP images were rendered by post
processing.
CONTRAST:  18mL MULTIHANCE GADOBENATE DIMEGLUMINE 529 MG/ML IV SOLN

[Series 5: T2 · coronal · 5.0mm · 1.48mm/px · 1 of 36 slices shown (1 of 4)]
[im 1/36]
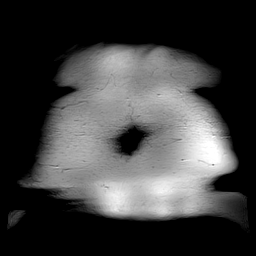

[Series 6: T1 · axial · 5.0mm · 0.74mm/px · z∈[-112,+98]mm · 3 of 72 slices shown]
[im 1/72]
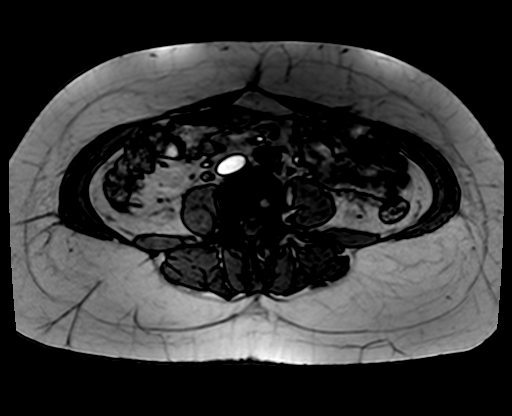
[im 36/72]
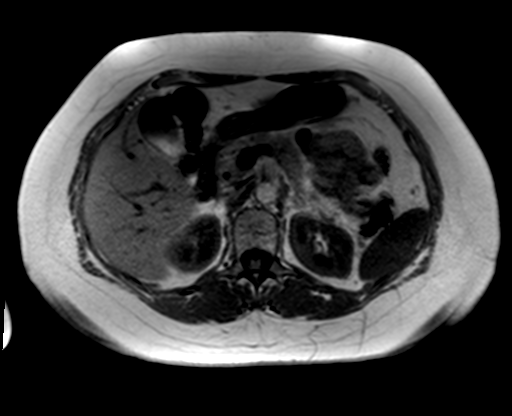
[im 72/72]
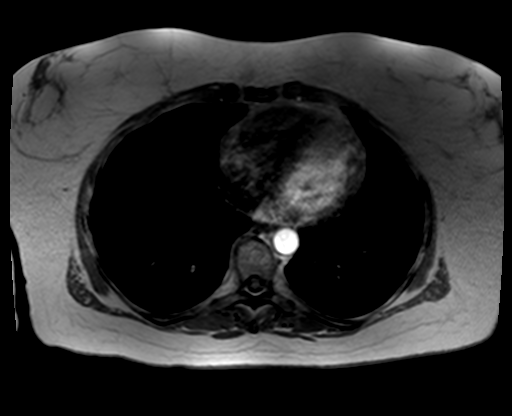

[Series 7: T2 · axial · 5.0mm · 1.48mm/px · z∈[-47,+175]mm · 2 of 38 slices shown (2 of 4)]
[im 1/38]
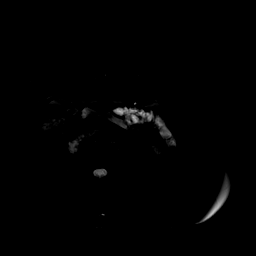
[im 38/38]
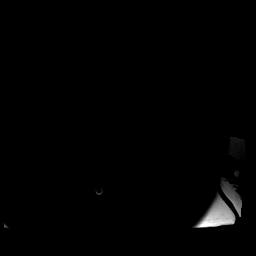

[Series 8: DWI · axial · 5.0mm · 1.42mm/px · z∈[-101,+109]mm · 4 of 108 slices shown (1 of 2)]
[im 1/108]
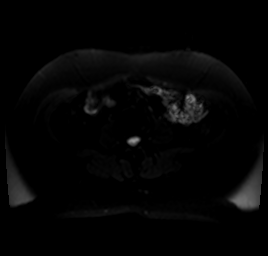
[im 36/108]
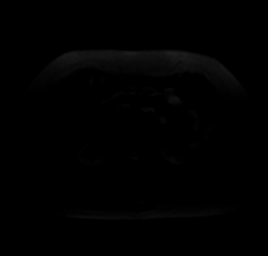
[im 72/108]
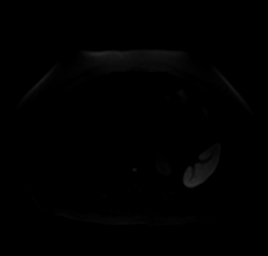
[im 108/108]
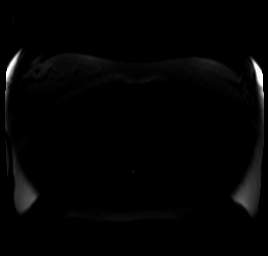

[Series 9: DWI · axial · 5.0mm · 1.42mm/px · 1 of 36 slices shown (2 of 2)]
[im 1/36]
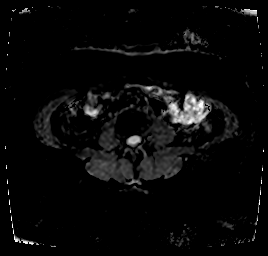

[Series 10: T2 · axial · 6.0mm · 1.22mm/px · 1 of 30 slices shown (3 of 4)]
[im 1/30]
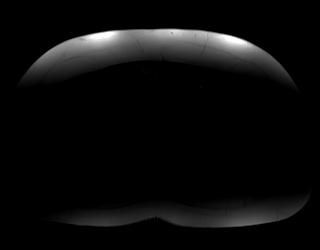

[Series 11: bSSFP · axial · 5.0mm · 1.25mm/px · z∈[-105,+117]mm · 2 of 38 slices shown]
[im 1/38]
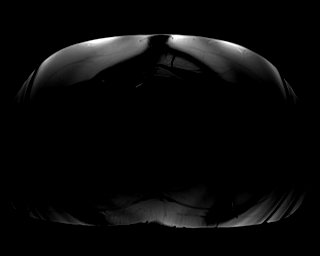
[im 38/38]
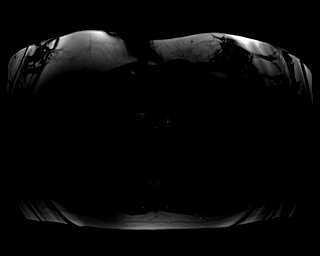

[Series 14: T2 · coronal · 3.0mm · 1.19mm/px · 1 of 24 slices shown (4 of 4)]
[im 1/24]
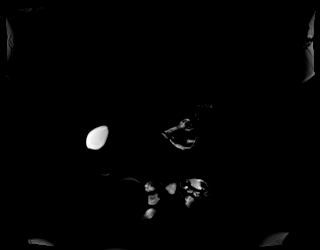

[Series 16: MRCP · coronal · 1.0mm · 0.49mm/px · 3 of 80 slices shown]
[im 1/80]
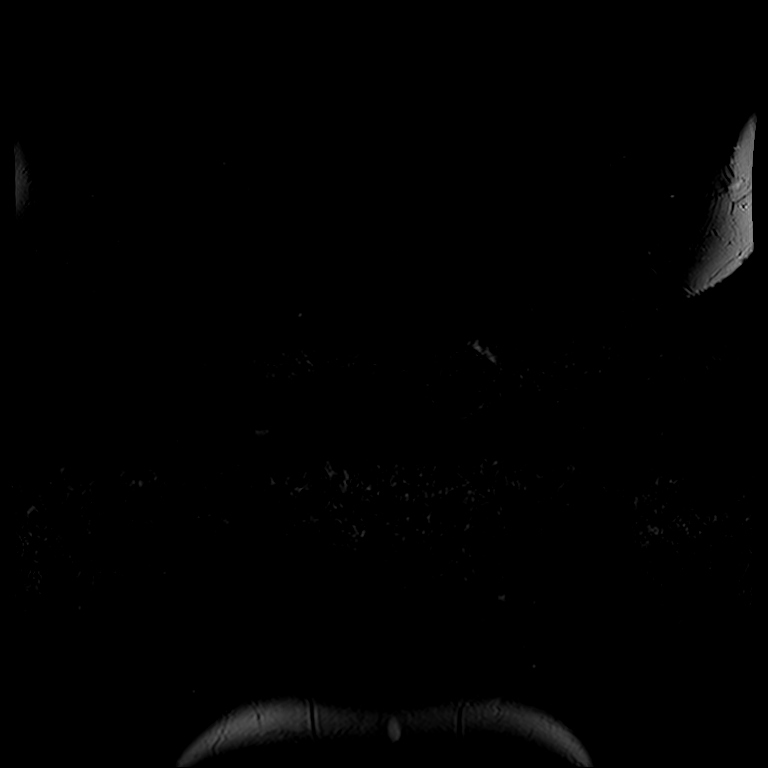
[im 40/80]
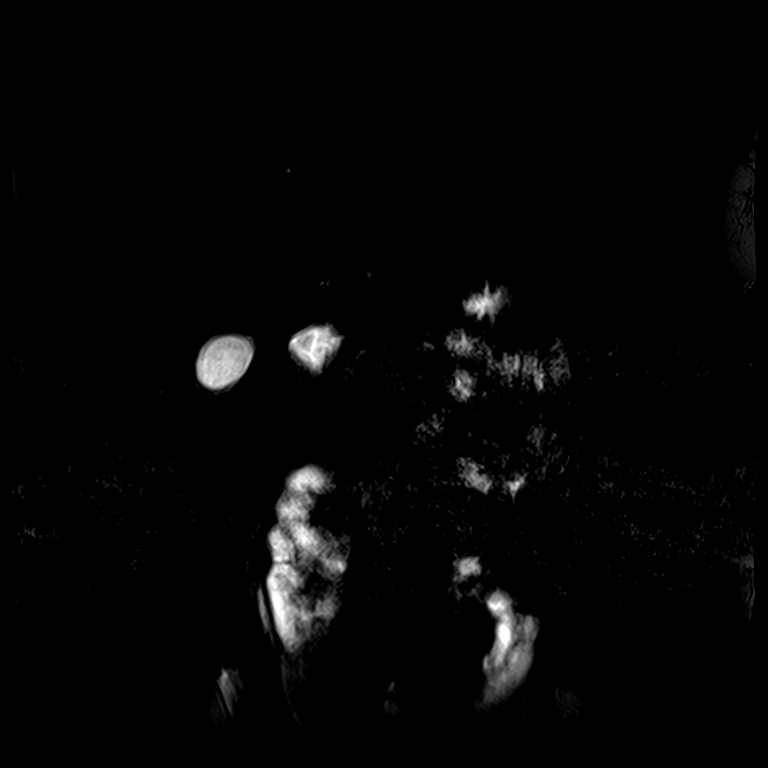
[im 80/80]
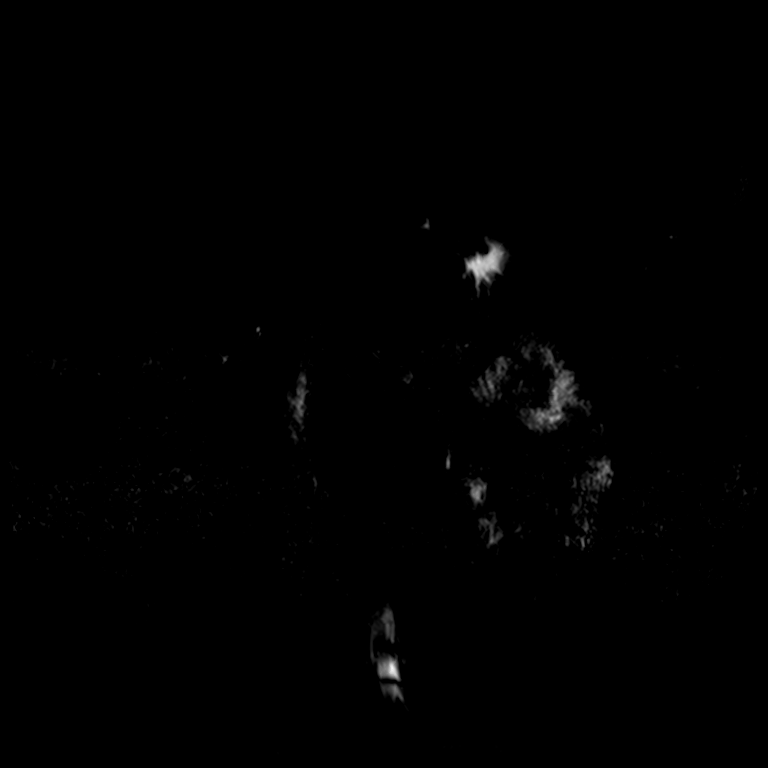

[Series 18: T1 dynamic · axial · non-contrast · 3.0mm · 1.25mm/px · z∈[-108,+105]mm · 3 of 72 slices shown]
[im 1/72]
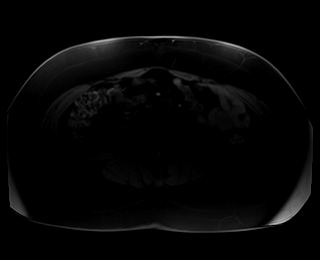
[im 36/72]
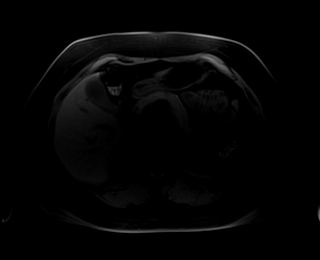
[im 72/72]
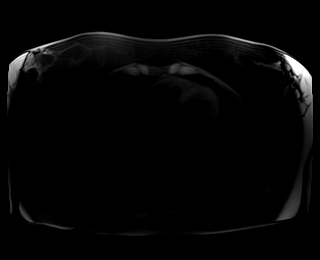

[Series 19: T1 dynamic post-contrast · axial · 3.0mm · 1.25mm/px · z∈[-108,+105]mm · 3 of 72 slices shown (1 of 5)]
[im 1/72]
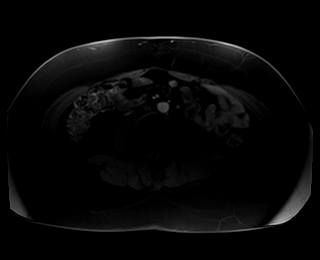
[im 36/72]
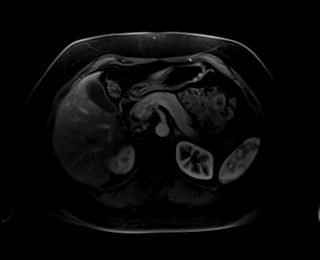
[im 72/72]
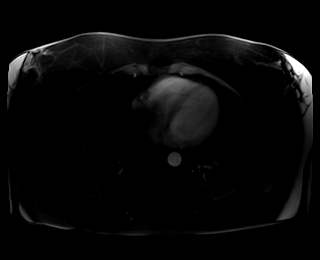

[Series 20: T1 dynamic post-contrast · axial · 3.0mm · 1.25mm/px · z∈[-108,+105]mm · 3 of 72 slices shown (2 of 5)]
[im 1/72]
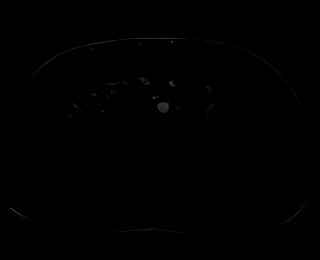
[im 36/72]
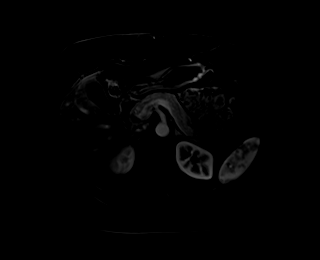
[im 72/72]
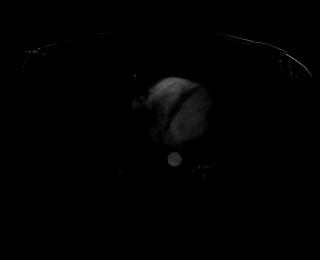

[Series 21: T1 dynamic post-contrast · axial · 3.0mm · 1.25mm/px · z∈[-108,+105]mm · 3 of 72 slices shown (3 of 5)]
[im 1/72]
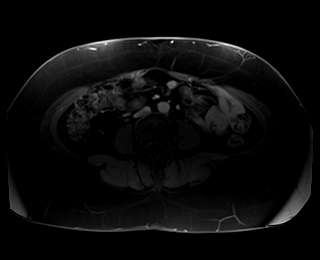
[im 36/72]
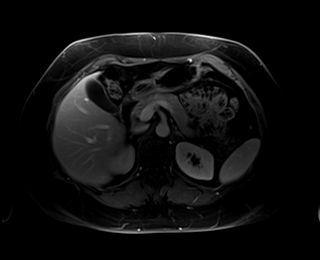
[im 72/72]
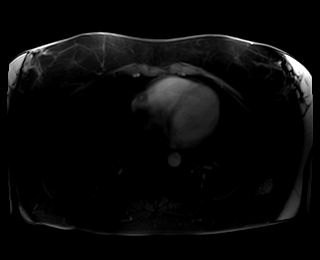

[Series 22: T1 dynamic post-contrast · axial · 3.0mm · 1.25mm/px · z∈[-108,+105]mm · 3 of 72 slices shown (4 of 5)]
[im 1/72]
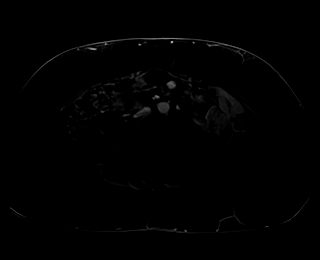
[im 36/72]
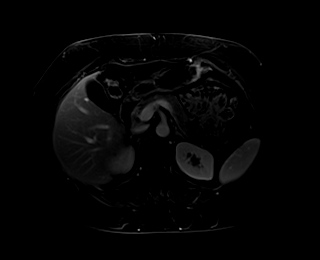
[im 72/72]
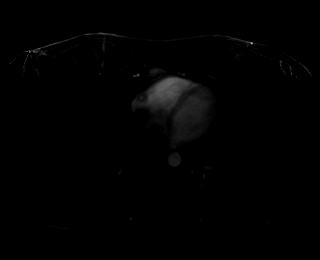

[Series 23: T1 dynamic post-contrast · axial · 3.0mm · 1.25mm/px · z∈[-108,+105]mm · 3 of 72 slices shown (5 of 5)]
[im 1/72]
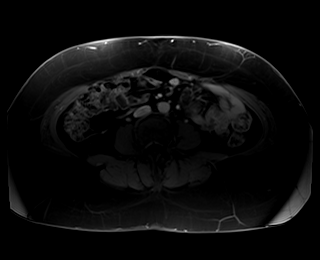
[im 36/72]
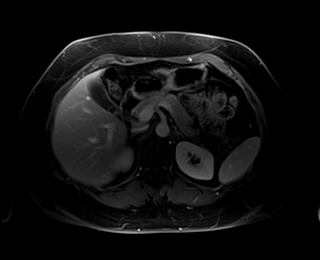
[im 72/72]
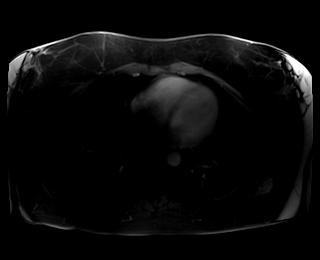

[36 of 48 positions shown; findings below may reference images not displayed]

FINDINGS: Lower chest: No acute findings.

Hepatobiliary: Liver is normal in size and contour with no
suspicious mass identified. Tiny cyst in the left hepatic lobe.
Gallbladder appears normal. No biliary ductal dilatation.

Pancreas: No mass, inflammatory changes, or other parenchymal
abnormality identified.

Spleen:  Within normal limits in size and appearance.

Adrenals/Urinary Tract: Adrenal glands are normal. 9 mm hyperintense
T1 signal hemorrhagic/proteinaceous cyst in the lower pole left
kidney. No hydronephrosis.

Stomach/Bowel: Visualized portions within the abdomen are
unremarkable.

Vascular/Lymphatic: No pathologically enlarged lymph nodes
identified. No abdominal aortic aneurysm demonstrated.

Other:  None.

Musculoskeletal: No suspicious bone lesions identified.
IMPRESSION: 1. No acute process or suspicious mass identified in the abdomen.
2. Small hemorrhagic/proteinaceous cyst in the left kidney.

## 2022-12-10 DIAGNOSIS — R7989 Other specified abnormal findings of blood chemistry: Secondary | ICD-10-CM | POA: Diagnosis not present

## 2022-12-10 DIAGNOSIS — E039 Hypothyroidism, unspecified: Secondary | ICD-10-CM | POA: Diagnosis not present

## 2022-12-10 DIAGNOSIS — E785 Hyperlipidemia, unspecified: Secondary | ICD-10-CM | POA: Diagnosis not present

## 2022-12-10 DIAGNOSIS — I10 Essential (primary) hypertension: Secondary | ICD-10-CM | POA: Diagnosis not present

## 2022-12-10 DIAGNOSIS — R7301 Impaired fasting glucose: Secondary | ICD-10-CM | POA: Diagnosis not present

## 2022-12-17 ENCOUNTER — Other Ambulatory Visit: Payer: Self-pay | Admitting: Internal Medicine

## 2022-12-17 DIAGNOSIS — E785 Hyperlipidemia, unspecified: Secondary | ICD-10-CM | POA: Diagnosis not present

## 2022-12-17 DIAGNOSIS — Z0000 Encounter for general adult medical examination without abnormal findings: Secondary | ICD-10-CM | POA: Diagnosis not present

## 2022-12-17 DIAGNOSIS — F329 Major depressive disorder, single episode, unspecified: Secondary | ICD-10-CM | POA: Diagnosis not present

## 2022-12-17 DIAGNOSIS — E28319 Asymptomatic premature menopause: Secondary | ICD-10-CM | POA: Diagnosis not present

## 2022-12-17 DIAGNOSIS — I10 Essential (primary) hypertension: Secondary | ICD-10-CM | POA: Diagnosis not present

## 2022-12-17 DIAGNOSIS — M791 Myalgia, unspecified site: Secondary | ICD-10-CM | POA: Diagnosis not present

## 2022-12-17 DIAGNOSIS — M2550 Pain in unspecified joint: Secondary | ICD-10-CM | POA: Diagnosis not present

## 2022-12-17 DIAGNOSIS — E039 Hypothyroidism, unspecified: Secondary | ICD-10-CM | POA: Diagnosis not present

## 2022-12-17 DIAGNOSIS — Z1331 Encounter for screening for depression: Secondary | ICD-10-CM | POA: Diagnosis not present

## 2022-12-17 DIAGNOSIS — R82998 Other abnormal findings in urine: Secondary | ICD-10-CM | POA: Diagnosis not present

## 2022-12-19 DIAGNOSIS — E785 Hyperlipidemia, unspecified: Secondary | ICD-10-CM | POA: Diagnosis not present

## 2022-12-19 DIAGNOSIS — I10 Essential (primary) hypertension: Secondary | ICD-10-CM | POA: Diagnosis not present

## 2022-12-19 DIAGNOSIS — E663 Overweight: Secondary | ICD-10-CM | POA: Diagnosis not present

## 2022-12-19 DIAGNOSIS — R7301 Impaired fasting glucose: Secondary | ICD-10-CM | POA: Diagnosis not present

## 2022-12-20 ENCOUNTER — Other Ambulatory Visit (HOSPITAL_BASED_OUTPATIENT_CLINIC_OR_DEPARTMENT_OTHER): Payer: Self-pay

## 2022-12-20 MED ORDER — ZEPBOUND 2.5 MG/0.5ML ~~LOC~~ SOAJ
2.5000 mg | SUBCUTANEOUS | 0 refills | Status: DC
  Filled 2022-12-20 (×2): qty 2, 28d supply, fill #0

## 2023-01-02 DIAGNOSIS — M791 Myalgia, unspecified site: Secondary | ICD-10-CM | POA: Diagnosis not present

## 2023-01-02 DIAGNOSIS — M25512 Pain in left shoulder: Secondary | ICD-10-CM | POA: Diagnosis not present

## 2023-01-02 DIAGNOSIS — R5383 Other fatigue: Secondary | ICD-10-CM | POA: Diagnosis not present

## 2023-01-02 DIAGNOSIS — L409 Psoriasis, unspecified: Secondary | ICD-10-CM | POA: Diagnosis not present

## 2023-01-02 DIAGNOSIS — M25511 Pain in right shoulder: Secondary | ICD-10-CM | POA: Diagnosis not present

## 2023-01-04 ENCOUNTER — Ambulatory Visit
Admission: RE | Admit: 2023-01-04 | Discharge: 2023-01-04 | Disposition: A | Discharge status: Home / Self Care | Payer: No Typology Code available for payment source | Source: Ambulatory Visit | Attending: Internal Medicine | Admitting: Internal Medicine

## 2023-01-04 DIAGNOSIS — E785 Hyperlipidemia, unspecified: Secondary | ICD-10-CM

## 2023-01-07 ENCOUNTER — Other Ambulatory Visit (HOSPITAL_BASED_OUTPATIENT_CLINIC_OR_DEPARTMENT_OTHER): Payer: Self-pay

## 2023-01-07 MED ORDER — ZEPBOUND 2.5 MG/0.5ML ~~LOC~~ SOAJ
2.0500 mg | SUBCUTANEOUS | 0 refills | Status: AC
  Filled 2023-01-07 – 2023-01-09 (×3): qty 2, 28d supply, fill #0

## 2023-01-07 MED ORDER — ZEPBOUND 5 MG/0.5ML ~~LOC~~ SOAJ
5.0000 mg | SUBCUTANEOUS | 5 refills | Status: AC
  Filled 2023-01-07 – 2023-02-28 (×2): qty 2, 28d supply, fill #0
  Filled 2023-03-28: qty 2, 28d supply, fill #1
  Filled 2023-04-26 – 2023-05-07 (×2): qty 2, 28d supply, fill #2
  Filled 2023-05-30: qty 2, 28d supply, fill #3
  Filled 2023-06-27: qty 2, 28d supply, fill #4
  Filled 2023-07-25: qty 2, 28d supply, fill #5

## 2023-01-08 ENCOUNTER — Other Ambulatory Visit (HOSPITAL_BASED_OUTPATIENT_CLINIC_OR_DEPARTMENT_OTHER): Payer: Self-pay

## 2023-01-09 ENCOUNTER — Other Ambulatory Visit (HOSPITAL_BASED_OUTPATIENT_CLINIC_OR_DEPARTMENT_OTHER): Payer: Self-pay

## 2023-01-10 ENCOUNTER — Other Ambulatory Visit (HOSPITAL_BASED_OUTPATIENT_CLINIC_OR_DEPARTMENT_OTHER): Payer: Self-pay

## 2023-01-18 DIAGNOSIS — E28319 Asymptomatic premature menopause: Secondary | ICD-10-CM | POA: Diagnosis not present

## 2023-02-05 ENCOUNTER — Other Ambulatory Visit (HOSPITAL_BASED_OUTPATIENT_CLINIC_OR_DEPARTMENT_OTHER): Payer: Self-pay

## 2023-02-05 MED ORDER — ZEPBOUND 5 MG/0.5ML ~~LOC~~ SOAJ
5.0000 mg | SUBCUTANEOUS | 0 refills | Status: DC
  Filled 2023-02-05: qty 2, 28d supply, fill #0
  Filled 2023-08-22 – 2023-08-29 (×2): qty 2, 28d supply, fill #1
  Filled 2023-09-25: qty 2, 28d supply, fill #2

## 2023-02-06 DIAGNOSIS — R768 Other specified abnormal immunological findings in serum: Secondary | ICD-10-CM | POA: Diagnosis not present

## 2023-02-06 DIAGNOSIS — L409 Psoriasis, unspecified: Secondary | ICD-10-CM | POA: Diagnosis not present

## 2023-02-06 DIAGNOSIS — M1991 Primary osteoarthritis, unspecified site: Secondary | ICD-10-CM | POA: Diagnosis not present

## 2023-02-06 DIAGNOSIS — M25511 Pain in right shoulder: Secondary | ICD-10-CM | POA: Diagnosis not present

## 2023-02-28 ENCOUNTER — Other Ambulatory Visit (HOSPITAL_BASED_OUTPATIENT_CLINIC_OR_DEPARTMENT_OTHER): Payer: Self-pay

## 2023-03-20 DIAGNOSIS — E039 Hypothyroidism, unspecified: Secondary | ICD-10-CM | POA: Diagnosis not present

## 2023-03-20 DIAGNOSIS — E785 Hyperlipidemia, unspecified: Secondary | ICD-10-CM | POA: Diagnosis not present

## 2023-03-20 DIAGNOSIS — E663 Overweight: Secondary | ICD-10-CM | POA: Diagnosis not present

## 2023-03-20 DIAGNOSIS — R7301 Impaired fasting glucose: Secondary | ICD-10-CM | POA: Diagnosis not present

## 2023-03-20 DIAGNOSIS — I10 Essential (primary) hypertension: Secondary | ICD-10-CM | POA: Diagnosis not present

## 2023-05-06 ENCOUNTER — Other Ambulatory Visit (HOSPITAL_BASED_OUTPATIENT_CLINIC_OR_DEPARTMENT_OTHER): Payer: Self-pay

## 2023-05-07 ENCOUNTER — Other Ambulatory Visit (HOSPITAL_BASED_OUTPATIENT_CLINIC_OR_DEPARTMENT_OTHER): Payer: Self-pay

## 2023-06-19 DIAGNOSIS — I10 Essential (primary) hypertension: Secondary | ICD-10-CM | POA: Diagnosis not present

## 2023-08-26 ENCOUNTER — Other Ambulatory Visit: Payer: Self-pay

## 2023-08-26 ENCOUNTER — Other Ambulatory Visit (HOSPITAL_BASED_OUTPATIENT_CLINIC_OR_DEPARTMENT_OTHER): Payer: Self-pay

## 2023-08-29 ENCOUNTER — Other Ambulatory Visit (HOSPITAL_BASED_OUTPATIENT_CLINIC_OR_DEPARTMENT_OTHER): Payer: Self-pay

## 2023-08-30 ENCOUNTER — Other Ambulatory Visit (HOSPITAL_BASED_OUTPATIENT_CLINIC_OR_DEPARTMENT_OTHER): Payer: Self-pay

## 2023-08-30 DIAGNOSIS — E039 Hypothyroidism, unspecified: Secondary | ICD-10-CM | POA: Diagnosis not present

## 2023-08-30 DIAGNOSIS — I10 Essential (primary) hypertension: Secondary | ICD-10-CM | POA: Diagnosis not present

## 2023-08-30 DIAGNOSIS — R7301 Impaired fasting glucose: Secondary | ICD-10-CM | POA: Diagnosis not present

## 2023-08-30 DIAGNOSIS — E663 Overweight: Secondary | ICD-10-CM | POA: Diagnosis not present

## 2023-08-30 DIAGNOSIS — E785 Hyperlipidemia, unspecified: Secondary | ICD-10-CM | POA: Diagnosis not present

## 2023-09-25 ENCOUNTER — Other Ambulatory Visit (HOSPITAL_BASED_OUTPATIENT_CLINIC_OR_DEPARTMENT_OTHER): Payer: Self-pay

## 2023-09-25 DIAGNOSIS — I10 Essential (primary) hypertension: Secondary | ICD-10-CM | POA: Diagnosis not present

## 2023-10-09 ENCOUNTER — Encounter: Payer: Self-pay | Admitting: Genetic Counselor

## 2023-10-22 ENCOUNTER — Other Ambulatory Visit (HOSPITAL_BASED_OUTPATIENT_CLINIC_OR_DEPARTMENT_OTHER): Payer: Self-pay

## 2023-10-23 ENCOUNTER — Other Ambulatory Visit (HOSPITAL_BASED_OUTPATIENT_CLINIC_OR_DEPARTMENT_OTHER): Payer: Self-pay

## 2023-10-23 MED ORDER — ZEPBOUND 5 MG/0.5ML ~~LOC~~ SOAJ
SUBCUTANEOUS | 3 refills | Status: AC
  Filled 2023-10-23: qty 2, 28d supply, fill #0
  Filled 2023-11-17: qty 2, 28d supply, fill #1
  Filled 2023-12-16: qty 2, 28d supply, fill #2
  Filled 2024-01-12: qty 2, 28d supply, fill #3
  Filled 2024-02-09: qty 2, 28d supply, fill #4
  Filled 2024-03-05: qty 2, 28d supply, fill #5
  Filled 2024-04-01: qty 2, 28d supply, fill #6
  Filled 2024-04-30: qty 2, 28d supply, fill #7
  Filled 2024-05-28: qty 2, 28d supply, fill #8
  Filled 2024-06-25: qty 2, 28d supply, fill #9
  Filled 2024-07-22: qty 2, 28d supply, fill #10
  Filled 2024-08-19: qty 2, 28d supply, fill #11

## 2023-12-09 DIAGNOSIS — D485 Neoplasm of uncertain behavior of skin: Secondary | ICD-10-CM | POA: Diagnosis not present

## 2023-12-09 DIAGNOSIS — D2261 Melanocytic nevi of right upper limb, including shoulder: Secondary | ICD-10-CM | POA: Diagnosis not present

## 2023-12-09 DIAGNOSIS — D499 Neoplasm of unspecified behavior of unspecified site: Secondary | ICD-10-CM | POA: Diagnosis not present

## 2023-12-09 DIAGNOSIS — Z85828 Personal history of other malignant neoplasm of skin: Secondary | ICD-10-CM | POA: Diagnosis not present

## 2023-12-09 DIAGNOSIS — D225 Melanocytic nevi of trunk: Secondary | ICD-10-CM | POA: Diagnosis not present

## 2023-12-09 DIAGNOSIS — D2262 Melanocytic nevi of left upper limb, including shoulder: Secondary | ICD-10-CM | POA: Diagnosis not present

## 2023-12-23 DIAGNOSIS — E039 Hypothyroidism, unspecified: Secondary | ICD-10-CM | POA: Diagnosis not present

## 2023-12-24 DIAGNOSIS — R7301 Impaired fasting glucose: Secondary | ICD-10-CM | POA: Diagnosis not present

## 2023-12-24 DIAGNOSIS — E785 Hyperlipidemia, unspecified: Secondary | ICD-10-CM | POA: Diagnosis not present

## 2023-12-27 DIAGNOSIS — E039 Hypothyroidism, unspecified: Secondary | ICD-10-CM | POA: Diagnosis not present

## 2023-12-27 DIAGNOSIS — I10 Essential (primary) hypertension: Secondary | ICD-10-CM | POA: Diagnosis not present

## 2023-12-27 DIAGNOSIS — Z1331 Encounter for screening for depression: Secondary | ICD-10-CM | POA: Diagnosis not present

## 2023-12-27 DIAGNOSIS — R82998 Other abnormal findings in urine: Secondary | ICD-10-CM | POA: Diagnosis not present

## 2023-12-27 DIAGNOSIS — Z0000 Encounter for general adult medical examination without abnormal findings: Secondary | ICD-10-CM | POA: Diagnosis not present

## 2024-01-07 DIAGNOSIS — D485 Neoplasm of uncertain behavior of skin: Secondary | ICD-10-CM | POA: Diagnosis not present

## 2024-01-07 DIAGNOSIS — L988 Other specified disorders of the skin and subcutaneous tissue: Secondary | ICD-10-CM | POA: Diagnosis not present

## 2024-01-30 DIAGNOSIS — I10 Essential (primary) hypertension: Secondary | ICD-10-CM | POA: Diagnosis not present

## 2024-02-10 ENCOUNTER — Other Ambulatory Visit (HOSPITAL_BASED_OUTPATIENT_CLINIC_OR_DEPARTMENT_OTHER): Payer: Self-pay

## 2024-04-15 DIAGNOSIS — M9901 Segmental and somatic dysfunction of cervical region: Secondary | ICD-10-CM | POA: Diagnosis not present

## 2024-04-15 DIAGNOSIS — M50322 Other cervical disc degeneration at C5-C6 level: Secondary | ICD-10-CM | POA: Diagnosis not present

## 2024-04-15 DIAGNOSIS — M9902 Segmental and somatic dysfunction of thoracic region: Secondary | ICD-10-CM | POA: Diagnosis not present

## 2024-04-15 DIAGNOSIS — M5384 Other specified dorsopathies, thoracic region: Secondary | ICD-10-CM | POA: Diagnosis not present

## 2024-04-21 DIAGNOSIS — M9901 Segmental and somatic dysfunction of cervical region: Secondary | ICD-10-CM | POA: Diagnosis not present

## 2024-04-21 DIAGNOSIS — M9902 Segmental and somatic dysfunction of thoracic region: Secondary | ICD-10-CM | POA: Diagnosis not present

## 2024-04-21 DIAGNOSIS — M50322 Other cervical disc degeneration at C5-C6 level: Secondary | ICD-10-CM | POA: Diagnosis not present

## 2024-04-21 DIAGNOSIS — M5384 Other specified dorsopathies, thoracic region: Secondary | ICD-10-CM | POA: Diagnosis not present

## 2024-04-28 DIAGNOSIS — M9902 Segmental and somatic dysfunction of thoracic region: Secondary | ICD-10-CM | POA: Diagnosis not present

## 2024-04-28 DIAGNOSIS — M5384 Other specified dorsopathies, thoracic region: Secondary | ICD-10-CM | POA: Diagnosis not present

## 2024-04-28 DIAGNOSIS — M9901 Segmental and somatic dysfunction of cervical region: Secondary | ICD-10-CM | POA: Diagnosis not present

## 2024-04-28 DIAGNOSIS — M50322 Other cervical disc degeneration at C5-C6 level: Secondary | ICD-10-CM | POA: Diagnosis not present

## 2024-04-29 DIAGNOSIS — I10 Essential (primary) hypertension: Secondary | ICD-10-CM | POA: Diagnosis not present

## 2024-04-29 DIAGNOSIS — R7301 Impaired fasting glucose: Secondary | ICD-10-CM | POA: Diagnosis not present

## 2024-04-30 DIAGNOSIS — M50322 Other cervical disc degeneration at C5-C6 level: Secondary | ICD-10-CM | POA: Diagnosis not present

## 2024-04-30 DIAGNOSIS — M5384 Other specified dorsopathies, thoracic region: Secondary | ICD-10-CM | POA: Diagnosis not present

## 2024-04-30 DIAGNOSIS — M9901 Segmental and somatic dysfunction of cervical region: Secondary | ICD-10-CM | POA: Diagnosis not present

## 2024-04-30 DIAGNOSIS — M9902 Segmental and somatic dysfunction of thoracic region: Secondary | ICD-10-CM | POA: Diagnosis not present

## 2024-05-05 DIAGNOSIS — M50322 Other cervical disc degeneration at C5-C6 level: Secondary | ICD-10-CM | POA: Diagnosis not present

## 2024-05-05 DIAGNOSIS — M5384 Other specified dorsopathies, thoracic region: Secondary | ICD-10-CM | POA: Diagnosis not present

## 2024-05-05 DIAGNOSIS — M9901 Segmental and somatic dysfunction of cervical region: Secondary | ICD-10-CM | POA: Diagnosis not present

## 2024-05-05 DIAGNOSIS — M9902 Segmental and somatic dysfunction of thoracic region: Secondary | ICD-10-CM | POA: Diagnosis not present

## 2024-05-11 DIAGNOSIS — R300 Dysuria: Secondary | ICD-10-CM | POA: Diagnosis not present

## 2024-05-11 DIAGNOSIS — N390 Urinary tract infection, site not specified: Secondary | ICD-10-CM | POA: Diagnosis not present

## 2024-05-12 DIAGNOSIS — M50322 Other cervical disc degeneration at C5-C6 level: Secondary | ICD-10-CM | POA: Diagnosis not present

## 2024-05-12 DIAGNOSIS — M5384 Other specified dorsopathies, thoracic region: Secondary | ICD-10-CM | POA: Diagnosis not present

## 2024-05-12 DIAGNOSIS — M9902 Segmental and somatic dysfunction of thoracic region: Secondary | ICD-10-CM | POA: Diagnosis not present

## 2024-05-12 DIAGNOSIS — M9901 Segmental and somatic dysfunction of cervical region: Secondary | ICD-10-CM | POA: Diagnosis not present

## 2024-05-19 DIAGNOSIS — M5384 Other specified dorsopathies, thoracic region: Secondary | ICD-10-CM | POA: Diagnosis not present

## 2024-05-19 DIAGNOSIS — M9902 Segmental and somatic dysfunction of thoracic region: Secondary | ICD-10-CM | POA: Diagnosis not present

## 2024-05-19 DIAGNOSIS — M9901 Segmental and somatic dysfunction of cervical region: Secondary | ICD-10-CM | POA: Diagnosis not present

## 2024-05-19 DIAGNOSIS — M50322 Other cervical disc degeneration at C5-C6 level: Secondary | ICD-10-CM | POA: Diagnosis not present

## 2024-05-26 DIAGNOSIS — M9901 Segmental and somatic dysfunction of cervical region: Secondary | ICD-10-CM | POA: Diagnosis not present

## 2024-05-26 DIAGNOSIS — M9902 Segmental and somatic dysfunction of thoracic region: Secondary | ICD-10-CM | POA: Diagnosis not present

## 2024-05-26 DIAGNOSIS — M50322 Other cervical disc degeneration at C5-C6 level: Secondary | ICD-10-CM | POA: Diagnosis not present

## 2024-05-26 DIAGNOSIS — M5384 Other specified dorsopathies, thoracic region: Secondary | ICD-10-CM | POA: Diagnosis not present

## 2024-06-03 DIAGNOSIS — M50322 Other cervical disc degeneration at C5-C6 level: Secondary | ICD-10-CM | POA: Diagnosis not present

## 2024-06-03 DIAGNOSIS — M5384 Other specified dorsopathies, thoracic region: Secondary | ICD-10-CM | POA: Diagnosis not present

## 2024-06-03 DIAGNOSIS — M9901 Segmental and somatic dysfunction of cervical region: Secondary | ICD-10-CM | POA: Diagnosis not present

## 2024-06-03 DIAGNOSIS — M9902 Segmental and somatic dysfunction of thoracic region: Secondary | ICD-10-CM | POA: Diagnosis not present

## 2024-06-16 DIAGNOSIS — M9902 Segmental and somatic dysfunction of thoracic region: Secondary | ICD-10-CM | POA: Diagnosis not present

## 2024-06-16 DIAGNOSIS — M50322 Other cervical disc degeneration at C5-C6 level: Secondary | ICD-10-CM | POA: Diagnosis not present

## 2024-06-16 DIAGNOSIS — M9901 Segmental and somatic dysfunction of cervical region: Secondary | ICD-10-CM | POA: Diagnosis not present

## 2024-06-16 DIAGNOSIS — M5384 Other specified dorsopathies, thoracic region: Secondary | ICD-10-CM | POA: Diagnosis not present

## 2024-06-23 DIAGNOSIS — M50322 Other cervical disc degeneration at C5-C6 level: Secondary | ICD-10-CM | POA: Diagnosis not present

## 2024-06-23 DIAGNOSIS — M9902 Segmental and somatic dysfunction of thoracic region: Secondary | ICD-10-CM | POA: Diagnosis not present

## 2024-06-23 DIAGNOSIS — M9901 Segmental and somatic dysfunction of cervical region: Secondary | ICD-10-CM | POA: Diagnosis not present

## 2024-06-23 DIAGNOSIS — M5384 Other specified dorsopathies, thoracic region: Secondary | ICD-10-CM | POA: Diagnosis not present
